# Patient Record
Sex: Female | Born: 1965 | Race: Black or African American | Hispanic: No | Marital: Single | Smoking: Never smoker
Health system: Southern US, Community
[De-identification: ages and names within clinical notes are randomized; demographics above are authoritative.]

## PROBLEM LIST (undated history)

## (undated) DIAGNOSIS — F32A Depression, unspecified: Secondary | ICD-10-CM

## (undated) DIAGNOSIS — F419 Anxiety disorder, unspecified: Secondary | ICD-10-CM

## (undated) DIAGNOSIS — F329 Major depressive disorder, single episode, unspecified: Secondary | ICD-10-CM

## (undated) DIAGNOSIS — B009 Herpesviral infection, unspecified: Secondary | ICD-10-CM

## (undated) HISTORY — PX: BREAST BIOPSY: SHX20

## (undated) HISTORY — DX: Major depressive disorder, single episode, unspecified: F32.9

## (undated) HISTORY — DX: Herpesviral infection, unspecified: B00.9

## (undated) HISTORY — DX: Depression, unspecified: F32.A

## (undated) HISTORY — DX: Anxiety disorder, unspecified: F41.9

## (undated) HISTORY — PX: TUBAL LIGATION: SHX77

---

## 2000-12-25 ENCOUNTER — Emergency Department (HOSPITAL_COMMUNITY): Admission: EM | Admit: 2000-12-25 | Discharge: 2000-12-25 | Payer: Self-pay | Admitting: *Deleted

## 2001-07-07 ENCOUNTER — Emergency Department (HOSPITAL_COMMUNITY): Admission: EM | Admit: 2001-07-07 | Discharge: 2001-07-07 | Payer: Self-pay | Admitting: Emergency Medicine

## 2001-12-11 ENCOUNTER — Emergency Department (HOSPITAL_COMMUNITY): Admission: EM | Admit: 2001-12-11 | Discharge: 2001-12-11 | Payer: Self-pay | Admitting: *Deleted

## 2006-03-01 ENCOUNTER — Ambulatory Visit: Payer: Self-pay | Admitting: Orthopedic Surgery

## 2006-04-01 ENCOUNTER — Emergency Department (HOSPITAL_COMMUNITY): Admission: EM | Admit: 2006-04-01 | Discharge: 2006-04-01 | Payer: Self-pay | Admitting: Emergency Medicine

## 2007-06-28 ENCOUNTER — Ambulatory Visit (HOSPITAL_COMMUNITY): Admission: RE | Admit: 2007-06-28 | Discharge: 2007-06-28 | Payer: Self-pay | Admitting: Obstetrics & Gynecology

## 2008-02-28 ENCOUNTER — Emergency Department (HOSPITAL_COMMUNITY): Admission: EM | Admit: 2008-02-28 | Discharge: 2008-02-28 | Payer: Self-pay | Admitting: Emergency Medicine

## 2008-06-11 ENCOUNTER — Ambulatory Visit (HOSPITAL_COMMUNITY): Admission: RE | Admit: 2008-06-11 | Discharge: 2008-06-11 | Payer: Self-pay | Admitting: Family Medicine

## 2011-01-25 NOTE — Op Note (Signed)
[name], Traci Pratt              ACCOUNT NO.:  [id number]   MEDICAL RECORD NO.:  [id number]          PATIENT TYPE:  AMB   LOCATION:  DAY                           FACILITY:  [facility]   PHYSICIAN:  Lazaro Arms, M.D.   DATE OF BIRTH:  [birth year]   DATE OF PROCEDURE:  06/28/2007  DATE OF DISCHARGE:                               OPERATIVE REPORT   PREOPERATIVE DIAGNOSIS:  Patient desires permanent sterilization.   POSTOPERATIVE DIAGNOSIS:  Patient desires permanent sterilization.   PROCEDURE:  Laparoscopic tubal ligation using electrocautery.   SURGEON:  Lazaro Arms, M.D.   ANESTHESIA:  General endotracheal.   FINDINGS:  Patient had a normal uterus, tubes, and ovaries.   DESCRIPTION OF OPERATION:  Patient was taken to the operating room,  placed in the supine position.  Underwent general endotracheal  anesthesia.  Placed in dorsal lithotomy position.  Prepped and draped in  the usual sterile fashion.  A Hulka tenaculum was placed in the  endometrium.  An incision was made in the umbilicus.  An 11 mm nonbladed  trocar with direct visualization was used.  The peritoneal cavity was  entered without difficulty.  Fallopian tubes were identified, __________  and beyond distal __________  the region of each tube, 2.5 to 3 cm  bilaterally, with good hemostasis.  The pelvis was again found to be  normal.  Instruments were removed, gas allowed to escape.  The fascia  was closed as a single 0 Vicryl suture.  The skin was closed using skin  staples.  Patient tolerated the procedure well.  She experienced no  blood loss.  Was taken to the recovery room in good and stable  condition.  All counts were correct.      Lazaro Arms, M.D.  Electronically Signed     LHE/MEDQ  D:  06/28/2007  T:  06/28/2007  Job:  [id number]

## 2011-05-05 ENCOUNTER — Encounter: Payer: Self-pay | Admitting: Family Medicine

## 2011-05-05 ENCOUNTER — Ambulatory Visit (INDEPENDENT_AMBULATORY_CARE_PROVIDER_SITE_OTHER): Payer: BC Managed Care – PPO | Admitting: Family Medicine

## 2011-05-05 VITALS — BP 110/78 | HR 103 | Ht 70.5 in | Wt 168.0 lb

## 2011-05-05 DIAGNOSIS — F419 Anxiety disorder, unspecified: Secondary | ICD-10-CM

## 2011-05-05 DIAGNOSIS — E559 Vitamin D deficiency, unspecified: Secondary | ICD-10-CM

## 2011-05-05 DIAGNOSIS — F329 Major depressive disorder, single episode, unspecified: Secondary | ICD-10-CM

## 2011-05-05 DIAGNOSIS — G4700 Insomnia, unspecified: Secondary | ICD-10-CM

## 2011-05-05 DIAGNOSIS — E785 Hyperlipidemia, unspecified: Secondary | ICD-10-CM

## 2011-05-05 DIAGNOSIS — F411 Generalized anxiety disorder: Secondary | ICD-10-CM

## 2011-05-05 DIAGNOSIS — F32A Depression, unspecified: Secondary | ICD-10-CM

## 2011-05-05 MED ORDER — VITAMIN D 1000 UNITS PO CAPS: 1000.0000 [IU] | ORAL_CAPSULE | Freq: Every day | ORAL | Status: DC

## 2011-05-05 MED ORDER — ZOLPIDEM TARTRATE ER 12.5 MG PO TBCR: 12.5000 mg | EXTENDED_RELEASE_TABLET | Freq: Every evening | ORAL | Status: DC | PRN

## 2011-05-05 NOTE — Progress Notes (Signed)
  Subjective:    Patient ID: Traci Pratt, female    DOB: 04/29/1966, 45 y.o.   MRN: [id number]  HPI  Pt here to establish care Previous Dr. Tammy Sours  in [place] Medication and History reviewed Depression/anxiety- on Pamelor for many years, has side effects of dry mouth, has tried Paxil, wellbutrin, she was also on Pristiq samples by her PCP (note pt did not know the name of this during the visit, obtained from pharmacy) but her insurance will not cover. Depression stems from a lot of family stress and loss of her parents. No hospitlizations, no SI. Would like medication similar to her pristiq, because she feels like her mood has been off since she ran out of the meds, she is more irritable, has found herself crying and overall does not feel well.   Insomnia- uses ambien CR has been on this for years , work starts at [employer], works 12 hour shifts , has tried Tylenol pm, Lunesta,    Menopausal- for past 4 years, started age 34, had BTL at age 33, has had hormonal work up by previous PCP  Vitamin D deficiency- has completed Vitamin D 8 week course, but not sure how much to take now  Hyperlipidemia- told her cholesterol was "out of whack", no meds given by previous PCP     Review of Systems   GEN- denies fatigue, fever, weight loss,weakness, recent illness HEENT- denies eye drainage, change in vision, nasal discharge, CVS- denies chest pain, palpitations RESP- denies SOB, cough, wheeze ABD- denies N/V, +constipations , abd pain GU- denies dysuria, hematuria, dribbling, incontinence MSK- denies joint pain, +muscle aches-feet after long hours, injury Neuro- denies headache, dizziness, syncope, seizure activity      Objective:   Physical Exam GEN- NAD, alert and oriented x 3  HEENT- fair dentition, PERRL, EOMI, Non icteric, pink conjunctiva, MMM, mild proptosis  Neck- no thyromegaly CVS- mild tachycardia, no murmur  resp- ctab ABD- NABS, soft, NT, ND EXT- no edema Pulses- 2+ radial ,  DP Psych- not depressed appearing, not overly anxious, no apparent SI, no apparent hallucinations       Assessment & Plan:

## 2011-05-05 NOTE — Patient Instructions (Addendum)
Follow up in 1 month  We will follow-up on your medication  I will get records from Dr. Parke Simmers Use inserts for your shoes Take the vitamin D once a day  Schedule a visit for your PAP Smear I will set you up for a Mammogram

## 2011-05-06 ENCOUNTER — Encounter: Payer: Self-pay | Admitting: Family Medicine

## 2011-05-06 DIAGNOSIS — F32A Depression, unspecified: Secondary | ICD-10-CM | POA: Insufficient documentation

## 2011-05-06 DIAGNOSIS — E785 Hyperlipidemia, unspecified: Secondary | ICD-10-CM | POA: Insufficient documentation

## 2011-05-06 DIAGNOSIS — F419 Anxiety disorder, unspecified: Secondary | ICD-10-CM | POA: Insufficient documentation

## 2011-05-06 DIAGNOSIS — G4700 Insomnia, unspecified: Secondary | ICD-10-CM | POA: Insufficient documentation

## 2011-05-06 DIAGNOSIS — F329 Major depressive disorder, single episode, unspecified: Secondary | ICD-10-CM | POA: Insufficient documentation

## 2011-05-06 MED ORDER — VENLAFAXINE HCL 37.5 MG PO TABS: 37.5000 mg | ORAL_TABLET | Freq: Two times a day (BID) | ORAL | Status: DC

## 2011-05-06 NOTE — Assessment & Plan Note (Signed)
Patient has history of both depression and anxiety. She is also describing panic attacks. After calling her pharmacy it appears she was on nortriptyline as well as Pristique. Her sugars has not quite cover that medication. I will start her on Effexor which is very similar to that. She will followup in 4 weeks to see how her mood has improved.

## 2011-05-06 NOTE — Assessment & Plan Note (Signed)
Obtain records from PCP and review lipid panel before making any recommendations

## 2011-05-06 NOTE — Assessment & Plan Note (Signed)
Start maintenance Vitamin D

## 2011-05-06 NOTE — Assessment & Plan Note (Signed)
Per above, long standing history, change to effexor, no red flags

## 2011-05-06 NOTE — Assessment & Plan Note (Signed)
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

## 2011-06-06 ENCOUNTER — Encounter: Payer: BC Managed Care – PPO | Admitting: Family Medicine

## 2011-06-09 LAB — CBC
MCHC: 34.5
Platelets: 306
RBC: 4.57
RDW: 13.7

## 2011-06-09 LAB — URINALYSIS, ROUTINE W REFLEX MICROSCOPIC
Specific Gravity, Urine: 1.02
Urobilinogen, UA: 0.2

## 2011-06-09 LAB — BASIC METABOLIC PANEL
CO2: 25
Calcium: 9.1
Creatinine, Ser: 0.9
GFR calc Af Amer: >60
Glucose, Bld: 146 — ABNORMAL HIGH

## 2011-06-09 LAB — PREGNANCY, URINE: Preg Test, Ur: NEGATIVE

## 2011-06-09 LAB — URINE MICROSCOPIC-ADD ON

## 2011-06-23 LAB — URINALYSIS, ROUTINE W REFLEX MICROSCOPIC
Glucose, UA: NEGATIVE
Nitrite: NEGATIVE
Protein, ur: NEGATIVE
pH: 7

## 2011-06-23 LAB — BASIC METABOLIC PANEL
Calcium: 9.2
Creatinine, Ser: 0.94
GFR calc Af Amer: >60
GFR calc non Af Amer: >60

## 2011-06-23 LAB — HCG, QUANTITATIVE, PREGNANCY: hCG, Beta Chain, Quant, S: <2

## 2011-06-23 LAB — DIFFERENTIAL
Lymphs Abs: 1.7
Monocytes Relative: 5
Neutro Abs: 2.3
Neutrophils Relative %: 52

## 2011-06-23 LAB — CBC
RBC: 4.23
WBC: 4.5

## 2011-06-27 ENCOUNTER — Ambulatory Visit (INDEPENDENT_AMBULATORY_CARE_PROVIDER_SITE_OTHER): Payer: BC Managed Care – PPO | Admitting: Family Medicine

## 2011-06-27 ENCOUNTER — Other Ambulatory Visit (HOSPITAL_COMMUNITY)
Admission: RE | Admit: 2011-06-27 | Discharge: 2011-06-27 | Disposition: A | Discharge status: Home / Self Care | Payer: BC Managed Care – PPO | Source: Ambulatory Visit | Attending: Family Medicine | Admitting: Family Medicine

## 2011-06-27 ENCOUNTER — Encounter: Payer: Self-pay | Admitting: Family Medicine

## 2011-06-27 VITALS — BP 126/90 | HR 76 | Resp 16 | Ht 71.5 in | Wt 171.0 lb

## 2011-06-27 DIAGNOSIS — E559 Vitamin D deficiency, unspecified: Secondary | ICD-10-CM

## 2011-06-27 DIAGNOSIS — Z124 Encounter for screening for malignant neoplasm of cervix: Secondary | ICD-10-CM

## 2011-06-27 DIAGNOSIS — F32A Depression, unspecified: Secondary | ICD-10-CM

## 2011-06-27 DIAGNOSIS — Z0000 Encounter for general adult medical examination without abnormal findings: Secondary | ICD-10-CM

## 2011-06-27 DIAGNOSIS — Z1231 Encounter for screening mammogram for malignant neoplasm of breast: Secondary | ICD-10-CM

## 2011-06-27 DIAGNOSIS — F329 Major depressive disorder, single episode, unspecified: Secondary | ICD-10-CM

## 2011-06-27 DIAGNOSIS — Z01419 Encounter for gynecological examination (general) (routine) without abnormal findings: Secondary | ICD-10-CM | POA: Insufficient documentation

## 2011-06-27 DIAGNOSIS — F3289 Other specified depressive episodes: Secondary | ICD-10-CM

## 2011-06-27 DIAGNOSIS — E785 Hyperlipidemia, unspecified: Secondary | ICD-10-CM

## 2011-06-27 MED ORDER — VITAMIN D 1000 UNITS PO CAPS: 1000.0000 [IU] | ORAL_CAPSULE | Freq: Every day | ORAL | Status: AC

## 2011-06-27 MED ORDER — VALACYCLOVIR HCL 1 G PO TABS: 1000.0000 mg | ORAL_TABLET | Freq: Every day | ORAL | Status: DC

## 2011-06-27 NOTE — Patient Instructions (Addendum)
We will set you up for an evening Mammogram ( After 3pm) Take the vitamin D daily Continue your other medications We will send a letter with PAP Smear results GEt your labs done- do not eat after midnight F/U in 4 months for mood

## 2011-06-28 ENCOUNTER — Encounter: Payer: Self-pay | Admitting: Family Medicine

## 2011-06-28 NOTE — Assessment & Plan Note (Signed)
Stable on meds  

## 2011-06-28 NOTE — Progress Notes (Signed)
  Subjective:    Patient ID: Traci Pratt, female    DOB: 1966/01/04, 45 y.o.   MRN: [id number]  HPI Pt here for CPE- needs PAP, Mammogram Depression-Tolerating medications- initially tired with starting effexor, feels mood is stable, doing well, no crying episodes, does not feel depressed Not taking Vit D  Admitted she has history of HSV, no recent outbreaks, would like her Valtrex filled again, in case needed  Review of Systems     GEN- denies fatigue, fever, weight loss,weakness, recent illness HEENT- denies eye drainage, change in vision, nasal discharge, CVS- denies chest pain, palpitations RESP- denies SOB, cough, wheeze ABD- denies N/V, change in stools, abd pain GU- denies dysuria, hematuria, dribbling, incontinence GYN- no vaginal discharge, no current HSV lesions, no vaginal bleeding      Objective:   Physical Exam GEN- NAD, alert and oriented, HEENT- PERRL, EOMI, TM Clear bilat, caps on teeth, oropharynx clear CVS-RRR, no murmur RESP-CTAB Neck- supple, no thyromegaly Breast- normal symmetry, no nipple inversion,no nipple drainage, no nodules or lumps felt Nodes- no axillary nodes GU- normal external genitalia, vaginal mucosa pink and moist, cervix visualized no growth, no blood form os, no discharge, no CMT, no ovarian masses, uterus normal size Psych- not depressed or anxious appearing       Assessment & Plan:   CPE - done, PAP Smear done, send for Mammogram and labs  Vit D- pt to restart Vitamin D, check levels  Depression- overall mood stable and improved, continue Effexor

## 2011-07-05 ENCOUNTER — Telehealth: Payer: Self-pay | Admitting: *Deleted

## 2011-07-05 ENCOUNTER — Ambulatory Visit (HOSPITAL_COMMUNITY): Payer: BC Managed Care – PPO

## 2011-07-05 ENCOUNTER — Encounter: Payer: Self-pay | Admitting: *Deleted

## 2011-07-05 ENCOUNTER — Ambulatory Visit (HOSPITAL_COMMUNITY)
Admission: RE | Admit: 2011-07-05 | Discharge: 2011-07-05 | Disposition: A | Discharge status: Home / Self Care | Payer: BC Managed Care – PPO | Source: Ambulatory Visit | Attending: Family Medicine | Admitting: Family Medicine

## 2011-07-05 DIAGNOSIS — Z1231 Encounter for screening mammogram for malignant neoplasm of breast: Secondary | ICD-10-CM | POA: Insufficient documentation

## 2011-07-05 NOTE — Telephone Encounter (Signed)
Mailed normal pap letter

## 2011-07-05 NOTE — Telephone Encounter (Signed)
Message copied by Diamantina Monks on Tue Jul 05, 2011  2:13 PM ------      Message from: Milinda Antis F      Created: Wed Jun 29, 2011  1:17 PM       Normal PAP Smear

## 2011-07-15 ENCOUNTER — Other Ambulatory Visit: Payer: Self-pay | Admitting: Family Medicine

## 2011-07-15 DIAGNOSIS — R928 Other abnormal and inconclusive findings on diagnostic imaging of breast: Secondary | ICD-10-CM

## 2011-07-18 ENCOUNTER — Other Ambulatory Visit: Payer: Self-pay | Admitting: Family Medicine

## 2011-07-18 ENCOUNTER — Telehealth: Payer: Self-pay | Admitting: Family Medicine

## 2011-07-18 DIAGNOSIS — R928 Other abnormal and inconclusive findings on diagnostic imaging of breast: Secondary | ICD-10-CM

## 2011-07-18 NOTE — Telephone Encounter (Signed)
I left a message for pt to return call, I need to know if she was contacted for further imaging for abnormal Mammogram

## 2011-07-29 NOTE — Telephone Encounter (Signed)
Yes, she goes on the 19th

## 2011-08-01 ENCOUNTER — Ambulatory Visit
Admission: RE | Admit: 2011-08-01 | Discharge: 2011-08-01 | Disposition: A | Discharge status: Home / Self Care | Payer: BC Managed Care – PPO | Source: Ambulatory Visit | Attending: Family Medicine | Admitting: Family Medicine

## 2011-08-01 DIAGNOSIS — R928 Other abnormal and inconclusive findings on diagnostic imaging of breast: Secondary | ICD-10-CM

## 2011-08-31 ENCOUNTER — Other Ambulatory Visit: Payer: Self-pay

## 2011-08-31 MED ORDER — ZOLPIDEM TARTRATE ER 12.5 MG PO TBCR: 12.5000 mg | EXTENDED_RELEASE_TABLET | Freq: Every evening | ORAL | Status: DC | PRN

## 2011-09-15 ENCOUNTER — Other Ambulatory Visit: Payer: Self-pay | Admitting: Family Medicine

## 2011-09-22 ENCOUNTER — Other Ambulatory Visit: Payer: Self-pay

## 2011-09-29 ENCOUNTER — Telehealth: Payer: Self-pay | Admitting: Family Medicine

## 2011-09-29 ENCOUNTER — Other Ambulatory Visit: Payer: Self-pay

## 2011-09-29 MED ORDER — ZOLPIDEM TARTRATE ER 12.5 MG PO TBCR: 12.5000 mg | EXTENDED_RELEASE_TABLET | Freq: Every evening | ORAL | Status: DC | PRN

## 2011-09-29 NOTE — Telephone Encounter (Signed)
[facility]illed [facility]

## 2011-10-28 ENCOUNTER — Ambulatory Visit: Payer: BC Managed Care – PPO | Admitting: Family Medicine

## 2011-11-01 ENCOUNTER — Encounter: Payer: Self-pay | Admitting: Family Medicine

## 2011-11-01 ENCOUNTER — Ambulatory Visit: Payer: BC Managed Care – PPO | Admitting: Family Medicine

## 2011-11-11 ENCOUNTER — Ambulatory Visit (INDEPENDENT_AMBULATORY_CARE_PROVIDER_SITE_OTHER): Payer: BC Managed Care – PPO | Admitting: Family Medicine

## 2011-11-11 ENCOUNTER — Encounter: Payer: Self-pay | Admitting: Family Medicine

## 2011-11-11 VITALS — BP 122/76 | HR 93 | Resp 18 | Ht 71.5 in | Wt 169.0 lb

## 2011-11-11 DIAGNOSIS — F3289 Other specified depressive episodes: Secondary | ICD-10-CM

## 2011-11-11 DIAGNOSIS — F419 Anxiety disorder, unspecified: Secondary | ICD-10-CM

## 2011-11-11 DIAGNOSIS — F329 Major depressive disorder, single episode, unspecified: Secondary | ICD-10-CM

## 2011-11-11 DIAGNOSIS — F32A Depression, unspecified: Secondary | ICD-10-CM

## 2011-11-11 DIAGNOSIS — F411 Generalized anxiety disorder: Secondary | ICD-10-CM

## 2011-11-11 DIAGNOSIS — M25449 Effusion, unspecified hand: Secondary | ICD-10-CM

## 2011-11-11 DIAGNOSIS — M2540 Effusion, unspecified joint: Secondary | ICD-10-CM

## 2011-11-11 DIAGNOSIS — G4700 Insomnia, unspecified: Secondary | ICD-10-CM

## 2011-11-11 DIAGNOSIS — E785 Hyperlipidemia, unspecified: Secondary | ICD-10-CM

## 2011-11-11 MED ORDER — MELOXICAM 15 MG PO TABS: 15.0000 mg | ORAL_TABLET | Freq: Every day | ORAL | Status: DC

## 2011-11-11 MED ORDER — ZOLPIDEM TARTRATE ER 12.5 MG PO TBCR: 12.5000 mg | EXTENDED_RELEASE_TABLET | Freq: Every evening | ORAL | Status: DC | PRN

## 2011-11-11 NOTE — Patient Instructions (Signed)
Try Miralax 1 cap full twice a day as needed Dulcolax/ Senna kot daily as needed  Continue the effexor Get the x-rays of your hands Get the blood work done fasting  We will call with lab results and x-ray results Start Mobic for your joints. F/U in 4 weeks

## 2011-11-13 ENCOUNTER — Encounter: Payer: Self-pay | Admitting: Family Medicine

## 2011-11-13 DIAGNOSIS — M25449 Effusion, unspecified hand: Secondary | ICD-10-CM | POA: Insufficient documentation

## 2011-11-13 NOTE — Assessment & Plan Note (Addendum)
FLP due

## 2011-11-13 NOTE — Progress Notes (Signed)
  Subjective:    Patient ID: Traci Pratt, female    DOB: 1966/07/04, 46 y.o.   MRN: [id number]  HPI Pt here for routine follow-up  Depression/anxiety- taking effexor feels her anxiety is much better, her mood is better in general with the medication, but her appetite is down some. No hallucinations  Hand pain- joints are very stiff, throughout the day, she is unable to make a complete fist, she has had problems for a few years but it is getting worse, she has been using ibuprofen for some low back pain but this has not helped.  Insomnia- taking Ambien CR, able to fall asleep but does not stay asleep, she does not want anything to make her drowsy during the day, has tried Lunesta  Abnormal Mammogram- now clear, f/u imaging normal  Review of Systems GEN- denies fatigue, fever, weight loss,weakness, recent illness HEENT- denies eye drainage, change in vision, nasal discharge, CVS- denies chest pain, palpitations RESP- denies SOB, cough, wheeze ABD- denies N/V, + constipation/change in stools, abd pain GU- denies dysuria, hematuria, dribbling, incontinence MSK- + joint pain, muscle aches, injury Neuro- denies headache, dizziness, syncope, seizure activity        Objective:   Physical Exam GEN- NAD, alert and oriented x3 HEENT- PERRL, EOMI, non injected sclera, pink conjunctiva, MMM, oropharynx clear Neck- Supple, no thyromegaly CVS- RRR, no murmur RESP-CTAB EXT- No edema Hands- very stiff ROM, unable to make complete fist with right hand, mild swelling at MIP bilat, swans like curvature to bilateral hands R>L Pulses- Radial, DP- 2+ Psych- not depressed appearing, no anxious appearing       Assessment & Plan:

## 2011-11-13 NOTE — Assessment & Plan Note (Signed)
Obtain imaging, check inflammatory markers, and RF Start mobic

## 2011-11-13 NOTE — Assessment & Plan Note (Signed)
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

## 2011-11-13 NOTE — Assessment & Plan Note (Signed)
Continue meds, stable 

## 2011-11-13 NOTE — Assessment & Plan Note (Signed)
Overall doing much better with exception of sleep which has been a long term problems for her, continue meds

## 2011-12-01 ENCOUNTER — Other Ambulatory Visit: Payer: Self-pay | Admitting: Family Medicine

## 2011-12-01 ENCOUNTER — Ambulatory Visit (HOSPITAL_COMMUNITY)
Admission: RE | Admit: 2011-12-01 | Discharge: 2011-12-01 | Disposition: A | Discharge status: Home / Self Care | Payer: BC Managed Care – PPO | Source: Ambulatory Visit | Attending: Family Medicine | Admitting: Family Medicine

## 2011-12-01 DIAGNOSIS — M2540 Effusion, unspecified joint: Secondary | ICD-10-CM

## 2011-12-01 DIAGNOSIS — M7989 Other specified soft tissue disorders: Secondary | ICD-10-CM | POA: Insufficient documentation

## 2011-12-02 LAB — LIPID PANEL
Cholesterol: 188 mg/dL (ref 0–200)
HDL: 76 mg/dL (ref >39)
LDL Cholesterol: 105 mg/dL — ABNORMAL HIGH (ref 0–99)
VLDL: 7 mg/dL (ref 0–40)

## 2011-12-02 LAB — C-REACTIVE PROTEIN: CRP: 0.2 mg/dL (ref <0.60)

## 2011-12-02 LAB — RHEUMATOID FACTOR: Rhuematoid fact SerPl-aCnc: <10 IU/mL (ref <=14)

## 2011-12-02 LAB — COMPREHENSIVE METABOLIC PANEL
ALT: 17 U/L (ref 0–35)
AST: 17 U/L (ref 0–37)
Chloride: 104 mEq/L (ref 96–112)
Creat: 0.73 mg/dL (ref 0.50–1.10)
Sodium: 141 mEq/L (ref 135–145)
Total Bilirubin: 0.6 mg/dL (ref 0.3–1.2)

## 2011-12-02 LAB — CBC
HCT: 43.1 % (ref 36.0–46.0)
MCHC: 32.3 g/dL (ref 30.0–36.0)
MCV: 92.3 fL (ref 78.0–100.0)
RDW: 13.4 % (ref 11.5–15.5)

## 2011-12-02 LAB — SEDIMENTATION RATE: Sed Rate: 4 mm/hr (ref 0–22)

## 2011-12-04 LAB — VITAMIN D 1,25 DIHYDROXY
Vitamin D 1, 25 (OH)2 Total: 81 pg/mL — ABNORMAL HIGH (ref 18–72)
Vitamin D2 1, 25 (OH)2: <8 pg/mL

## 2011-12-16 ENCOUNTER — Ambulatory Visit: Payer: BC Managed Care – PPO | Admitting: Family Medicine

## 2011-12-20 ENCOUNTER — Ambulatory Visit (INDEPENDENT_AMBULATORY_CARE_PROVIDER_SITE_OTHER): Payer: BC Managed Care – PPO | Admitting: Family Medicine

## 2011-12-20 ENCOUNTER — Encounter: Payer: Self-pay | Admitting: Family Medicine

## 2011-12-20 VITALS — BP 118/70 | HR 88 | Resp 18 | Ht 71.5 in | Wt 172.0 lb

## 2011-12-20 DIAGNOSIS — F411 Generalized anxiety disorder: Secondary | ICD-10-CM

## 2011-12-20 DIAGNOSIS — G4700 Insomnia, unspecified: Secondary | ICD-10-CM

## 2011-12-20 DIAGNOSIS — F3289 Other specified depressive episodes: Secondary | ICD-10-CM

## 2011-12-20 DIAGNOSIS — F32A Depression, unspecified: Secondary | ICD-10-CM

## 2011-12-20 DIAGNOSIS — E673 Hypervitaminosis D: Secondary | ICD-10-CM

## 2011-12-20 DIAGNOSIS — F329 Major depressive disorder, single episode, unspecified: Secondary | ICD-10-CM

## 2011-12-20 DIAGNOSIS — M25449 Effusion, unspecified hand: Secondary | ICD-10-CM

## 2011-12-20 DIAGNOSIS — F419 Anxiety disorder, unspecified: Secondary | ICD-10-CM

## 2011-12-20 MED ORDER — ZOLPIDEM TARTRATE ER 12.5 MG PO TBCR: 12.5000 mg | EXTENDED_RELEASE_TABLET | Freq: Every evening | ORAL | Status: DC | PRN

## 2011-12-20 MED ORDER — IBUPROFEN 800 MG PO TABS: 800.0000 mg | ORAL_TABLET | Freq: Three times a day (TID) | ORAL | Status: AC | PRN

## 2011-12-20 NOTE — Progress Notes (Signed)
  Subjective:    Patient ID: Traci Pratt, female    DOB: 1965-10-07, 46 y.o.   MRN: [id number]  HPI  Pt here for interim follow-up on labs and joint swelling, continues to have swelling in hands, difficulty making a fist and pain in hand joints. X rays reviewed  Insomnia- taking ambien but still does not stay asleep   Constipation- had BM with eating pecans, also using stool softner Labs reviewed  Review of Systems   GEN- denies fatigue, fever, weight loss,weakness, recent illness MSK- + joint pain, muscle aches, injury Neuro- denies headache, dizziness, syncope, seizure activity      Objective:   Physical Exam GEN-NAD, alert and oriented x 3 Hands-  Decreased flexion at MIP bilat, unable to make complete fist with right hand, mild swelling at MIP bilat, swans like curvature to bilateral hands R>L Pulses- Radial, DP- 2+ Psych- not depressed appearing, no anxious appearing         Assessment & Plan:

## 2011-12-20 NOTE — Assessment & Plan Note (Signed)
Stable, continue meds 

## 2011-12-20 NOTE — Patient Instructions (Signed)
Continue the current medications Use the ibuprofen as needed I will refer you to rheumatology Get your labs to your work F/U 4 months

## 2011-12-20 NOTE — Assessment & Plan Note (Addendum)
Normal ESR, RF, CRP X-rays unremarkable however exam still concerning. Will send to rheumatology for their assessment, pt concerned she may not be able to continue working if this worsens Mobic completed but little improvement Continue ibuprofen

## 2011-12-20 NOTE — Assessment & Plan Note (Signed)
My concern persist with other meds because she is on TCA and SSRI. She has tried [place], currently on Ambien. Will try to increase her Nortriptyline to 125mg .

## 2011-12-20 NOTE — Assessment & Plan Note (Signed)
Doing well, on effexor and TCA

## 2011-12-20 NOTE — Assessment & Plan Note (Signed)
Vit D elevated at 82, slightly above normal, pt has stopped extra supplement

## 2012-01-12 ENCOUNTER — Telehealth: Payer: Self-pay | Admitting: Family Medicine

## 2012-01-12 MED ORDER — NORTRIPTYLINE HCL 25 MG PO CAPS: ORAL_CAPSULE | ORAL | Status: DC

## 2012-01-12 MED ORDER — VENLAFAXINE HCL 37.5 MG PO TABS: ORAL_TABLET | ORAL | Status: DC

## 2012-01-12 NOTE — Telephone Encounter (Signed)
Spoke with pt, she is tolerating increased dose of TCA well. Meds sent for 90 day supply to [facility]

## 2012-01-17 ENCOUNTER — Ambulatory Visit: Payer: BC Managed Care – PPO | Admitting: Family Medicine

## 2012-01-27 ENCOUNTER — Ambulatory Visit: Payer: BC Managed Care – PPO | Admitting: Family Medicine

## 2012-03-13 ENCOUNTER — Other Ambulatory Visit: Payer: Self-pay | Admitting: Family Medicine

## 2012-03-13 ENCOUNTER — Other Ambulatory Visit: Payer: Self-pay

## 2012-03-13 ENCOUNTER — Telehealth: Payer: Self-pay | Admitting: Family Medicine

## 2012-03-13 NOTE — Telephone Encounter (Signed)
noted 

## 2012-03-21 ENCOUNTER — Ambulatory Visit: Payer: BC Managed Care – PPO | Admitting: Family Medicine

## 2012-03-23 ENCOUNTER — Ambulatory Visit (INDEPENDENT_AMBULATORY_CARE_PROVIDER_SITE_OTHER): Payer: BC Managed Care – PPO | Admitting: Family Medicine

## 2012-03-23 ENCOUNTER — Encounter: Payer: Self-pay | Admitting: Family Medicine

## 2012-03-23 VITALS — BP 122/82 | HR 82 | Resp 16 | Ht 71.5 in | Wt 170.1 lb

## 2012-03-23 DIAGNOSIS — F32A Depression, unspecified: Secondary | ICD-10-CM

## 2012-03-23 DIAGNOSIS — G4700 Insomnia, unspecified: Secondary | ICD-10-CM

## 2012-03-23 DIAGNOSIS — F419 Anxiety disorder, unspecified: Secondary | ICD-10-CM

## 2012-03-23 DIAGNOSIS — M25449 Effusion, unspecified hand: Secondary | ICD-10-CM

## 2012-03-23 DIAGNOSIS — F329 Major depressive disorder, single episode, unspecified: Secondary | ICD-10-CM

## 2012-03-23 DIAGNOSIS — F411 Generalized anxiety disorder: Secondary | ICD-10-CM

## 2012-03-23 DIAGNOSIS — F3289 Other specified depressive episodes: Secondary | ICD-10-CM

## 2012-03-23 NOTE — Patient Instructions (Signed)
Continue current medications Call Dr. Kellie Simmering for your hands F/U Mid October ( After 12th) for your physical with PAP Smear

## 2012-03-25 ENCOUNTER — Encounter: Payer: Self-pay | Admitting: Family Medicine

## 2012-03-25 NOTE — Assessment & Plan Note (Signed)
Stable no change to meds 

## 2012-03-25 NOTE — Assessment & Plan Note (Signed)
Stable, no change to meds 

## 2012-03-25 NOTE — Assessment & Plan Note (Signed)
Continue NSAIDS prn, reschedule with rheumatology

## 2012-03-25 NOTE — Assessment & Plan Note (Signed)
improved

## 2012-03-25 NOTE — Progress Notes (Signed)
  Subjective:    Patient ID: Traci Pratt, female    DOB: 1966/03/06, 46 y.o.   MRN: [id number]  HPI Pt presents to f/u chronic medical problems. SHe has no concerns. Doing well. 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 Insomnia- sleep has improved with Increase in Notriptyline to 125mg , also uses Ambien Anxiety/Depression- she is dealing with her son who has moved back home but overall feels things are going well, she has made some changes in her health and enjoys exercises and eating right Hand joint pain- she missed initial appt with rheumatologist continues to have stiffiness and pain uses OTC meds, plans to reschedule   Review of Systems  GEN- denies fatigue, fever, weight loss,weakness, recent illness HEENT- denies eye drainage, change in vision, nasal discharge, CVS- denies chest pain, palpitations RESP- denies SOB, cough, wheeze ABD- denies N/V, change in stools, abd pain GU- denies dysuria, hematuria, dribbling, incontinence MSK- + joint pain, muscle aches, injury Neuro- denies headache, dizziness, syncope, seizure activity      Objective:   Physical Exam GEN- NAD, alert and oriented x3 HEENT- PERRL, EOMI, non injected sclera, pink conjunctiva, MMM, oropharynx clear Neck- Supple, no thryomegaly CVS- RRR, no murmur RESP-CTAB ABD-NABS,soft,NT.ND EXT- No edema Pulses- Radial, DP- 2+ Psych-normal mood and affect, not depressed or overly anxious,        Assessment & Plan:

## 2012-06-05 ENCOUNTER — Other Ambulatory Visit: Payer: Self-pay | Admitting: Family Medicine

## 2012-06-05 ENCOUNTER — Telehealth: Payer: Self-pay | Admitting: Family Medicine

## 2012-06-05 ENCOUNTER — Other Ambulatory Visit: Payer: Self-pay

## 2012-06-05 NOTE — Telephone Encounter (Signed)
Med sent.

## 2012-06-15 ENCOUNTER — Other Ambulatory Visit: Payer: Self-pay

## 2012-06-15 MED ORDER — NORTRIPTYLINE HCL 25 MG PO CAPS: ORAL_CAPSULE | ORAL | Status: DC

## 2012-06-29 ENCOUNTER — Encounter: Payer: Self-pay | Admitting: Family Medicine

## 2012-06-29 ENCOUNTER — Other Ambulatory Visit (HOSPITAL_COMMUNITY)
Admission: RE | Admit: 2012-06-29 | Discharge: 2012-06-29 | Disposition: A | Discharge status: Home / Self Care | Payer: BC Managed Care – PPO | Source: Ambulatory Visit | Attending: Family Medicine | Admitting: Family Medicine

## 2012-06-29 ENCOUNTER — Ambulatory Visit (INDEPENDENT_AMBULATORY_CARE_PROVIDER_SITE_OTHER): Payer: BC Managed Care – PPO | Admitting: Family Medicine

## 2012-06-29 VITALS — BP 120/84 | HR 86 | Resp 16 | Wt 175.0 lb

## 2012-06-29 DIAGNOSIS — G5600 Carpal tunnel syndrome, unspecified upper limb: Secondary | ICD-10-CM

## 2012-06-29 DIAGNOSIS — Z23 Encounter for immunization: Secondary | ICD-10-CM

## 2012-06-29 DIAGNOSIS — Z01419 Encounter for gynecological examination (general) (routine) without abnormal findings: Secondary | ICD-10-CM

## 2012-06-29 DIAGNOSIS — Z124 Encounter for screening for malignant neoplasm of cervix: Secondary | ICD-10-CM

## 2012-06-29 DIAGNOSIS — Z1211 Encounter for screening for malignant neoplasm of colon: Secondary | ICD-10-CM

## 2012-06-29 LAB — POC HEMOCCULT BLD/STL (OFFICE/1-CARD/DIAGNOSTIC): Fecal Occult Blood, POC: NEGATIVE

## 2012-06-29 MED ORDER — SCOPOLAMINE 1 MG/3DAYS TD PT72: 1.0000 | MEDICATED_PATCH | TRANSDERMAL | Status: DC

## 2012-06-29 MED ORDER — VENLAFAXINE HCL 37.5 MG PO TABS: ORAL_TABLET | ORAL | Status: DC

## 2012-06-29 NOTE — Patient Instructions (Addendum)
I recommend eye visit once a year I recommend dental visit every 6 months Goal is to  Exercise 30 minutes 5 days a week We will send a letter with lab results  Try the wrist brace during the day to see if this helps symptoms Mammogram- please call and schedule your Mammogram at the Imaging center - Please give number for Dr. Kellie Simmering - Rhuematology Brace to [facility] F/U 4 months

## 2012-06-29 NOTE — Progress Notes (Signed)
  Subjective:    Patient ID: Traci Pratt, female    DOB: 19-May-1966, 46 y.o.   MRN: [id number]  HPI Patient here for GYN exam. She's been having tingling and numbness in her right hand over the thumb and first digit. She notices that when she goes to pick up items. It does not awaken her from sleep. She no longer has a menses. She's going on a cruise and is asking for something for nausea   Review of Systems - per above  GEN- denies fatigue, fever, weight loss,weakness, recent illness HEENT- denies eye drainage, change in vision, nasal discharge, CVS- denies chest pain, palpitations RESP- denies SOB, cough, wheeze ABD- denies N/V, change in stools, abd pain GU- denies dysuria, hematuria, dribbling, incontinence MSK- denies joint pain, muscle aches, injury Neuro- denies headache, dizziness, syncope, seizure activity      Objective:   Physical Exam GEN- NAD, alert and oriented x3 HEENT- PERRL, EOMI, non injected sclera, pink conjunctiva, MMM, oropharynx clear Neck- Supple, no thyromegaly CVS- RRR, no murmur RESP-CTAB ABD-NABS,soft,NT,ND GU- normal external genitalia, vaginal mucosa pink and moist, cervix visualized no growth, no blood form os, No discharge, no CMT, no ovarian masses, uterus normal size Rectal- normal tone, FOBT neg EXT- No edema Pulses- Radial, DP- 2+ Right Hand- Unable to make tight fist, neg tinels, neg phalens, normal tone, grasp good bilat        Assessment & Plan:    GYN Exam- PAP Smear done, Mammogram to be scheduled by patient. TDAP given,flu given at work

## 2012-07-01 DIAGNOSIS — G5600 Carpal tunnel syndrome, unspecified upper limb: Secondary | ICD-10-CM | POA: Insufficient documentation

## 2012-07-01 NOTE — Assessment & Plan Note (Signed)
Concern for possible CTS, Right hand, trial of wrist brace

## 2012-07-05 ENCOUNTER — Other Ambulatory Visit: Payer: Self-pay | Admitting: Family Medicine

## 2012-07-05 DIAGNOSIS — Z1231 Encounter for screening mammogram for malignant neoplasm of breast: Secondary | ICD-10-CM

## 2012-07-31 ENCOUNTER — Ambulatory Visit
Admission: RE | Admit: 2012-07-31 | Discharge: 2012-07-31 | Disposition: A | Discharge status: Home / Self Care | Payer: BC Managed Care – PPO | Source: Ambulatory Visit | Attending: Family Medicine | Admitting: Family Medicine

## 2012-07-31 DIAGNOSIS — Z1231 Encounter for screening mammogram for malignant neoplasm of breast: Secondary | ICD-10-CM

## 2012-08-13 ENCOUNTER — Other Ambulatory Visit: Payer: Self-pay | Admitting: Family Medicine

## 2012-08-13 DIAGNOSIS — R928 Other abnormal and inconclusive findings on diagnostic imaging of breast: Secondary | ICD-10-CM

## 2012-08-20 ENCOUNTER — Ambulatory Visit
Admission: RE | Admit: 2012-08-20 | Discharge: 2012-08-20 | Disposition: A | Discharge status: Home / Self Care | Payer: BC Managed Care – PPO | Source: Ambulatory Visit | Attending: Family Medicine | Admitting: Family Medicine

## 2012-08-20 ENCOUNTER — Other Ambulatory Visit: Payer: Self-pay | Admitting: Family Medicine

## 2012-08-20 DIAGNOSIS — R928 Other abnormal and inconclusive findings on diagnostic imaging of breast: Secondary | ICD-10-CM

## 2012-08-29 ENCOUNTER — Other Ambulatory Visit: Payer: Self-pay | Admitting: Family Medicine

## 2012-08-30 ENCOUNTER — Other Ambulatory Visit: Payer: Self-pay

## 2012-09-17 ENCOUNTER — Ambulatory Visit
Admission: RE | Admit: 2012-09-17 | Discharge: 2012-09-17 | Disposition: A | Discharge status: Home / Self Care | Payer: BC Managed Care – PPO | Source: Ambulatory Visit | Attending: Family Medicine | Admitting: Family Medicine

## 2012-09-17 ENCOUNTER — Ambulatory Visit
Admission: RE | Admit: 2012-09-17 | Discharge: 2012-09-17 | Disposition: A | Discharge status: Home / Self Care | Payer: BC Managed Care – PPO | Source: Ambulatory Visit

## 2012-09-17 ENCOUNTER — Other Ambulatory Visit: Payer: Self-pay | Admitting: Family Medicine

## 2012-09-17 DIAGNOSIS — R928 Other abnormal and inconclusive findings on diagnostic imaging of breast: Secondary | ICD-10-CM

## 2012-09-19 ENCOUNTER — Other Ambulatory Visit: Payer: Self-pay | Admitting: Family Medicine

## 2012-09-19 DIAGNOSIS — R928 Other abnormal and inconclusive findings on diagnostic imaging of breast: Secondary | ICD-10-CM

## 2012-10-05 ENCOUNTER — Telehealth: Payer: Self-pay | Admitting: Family Medicine

## 2012-10-05 ENCOUNTER — Other Ambulatory Visit: Payer: Self-pay

## 2012-10-05 MED ORDER — VALACYCLOVIR HCL 1 G PO TABS: 1000.0000 mg | ORAL_TABLET | Freq: Every day | ORAL | Status: AC

## 2012-10-05 NOTE — Telephone Encounter (Signed)
Med refilled.

## 2012-10-30 ENCOUNTER — Ambulatory Visit: Payer: BC Managed Care – PPO | Admitting: Family Medicine

## 2012-11-09 ENCOUNTER — Ambulatory Visit: Payer: BC Managed Care – PPO | Admitting: Family Medicine

## 2012-11-16 ENCOUNTER — Ambulatory Visit: Payer: BC Managed Care – PPO | Admitting: Family Medicine

## 2012-11-26 ENCOUNTER — Other Ambulatory Visit: Payer: Self-pay | Admitting: Family Medicine

## 2012-12-07 ENCOUNTER — Ambulatory Visit: Payer: BC Managed Care – PPO | Admitting: Family Medicine

## 2012-12-14 ENCOUNTER — Ambulatory Visit: Payer: BC Managed Care – PPO | Admitting: Family Medicine

## 2012-12-18 ENCOUNTER — Other Ambulatory Visit: Payer: Self-pay | Admitting: Family Medicine

## 2012-12-21 ENCOUNTER — Encounter: Payer: Self-pay | Admitting: Family Medicine

## 2012-12-21 ENCOUNTER — Ambulatory Visit (INDEPENDENT_AMBULATORY_CARE_PROVIDER_SITE_OTHER): Payer: BC Managed Care – PPO | Admitting: Family Medicine

## 2012-12-21 VITALS — BP 122/80 | HR 92 | Resp 18 | Ht 71.5 in | Wt 182.0 lb

## 2012-12-21 DIAGNOSIS — F411 Generalized anxiety disorder: Secondary | ICD-10-CM

## 2012-12-21 DIAGNOSIS — F419 Anxiety disorder, unspecified: Secondary | ICD-10-CM

## 2012-12-21 DIAGNOSIS — F329 Major depressive disorder, single episode, unspecified: Secondary | ICD-10-CM

## 2012-12-21 DIAGNOSIS — F3289 Other specified depressive episodes: Secondary | ICD-10-CM

## 2012-12-21 DIAGNOSIS — G4700 Insomnia, unspecified: Secondary | ICD-10-CM

## 2012-12-21 DIAGNOSIS — F32A Depression, unspecified: Secondary | ICD-10-CM

## 2012-12-21 MED ORDER — TEMAZEPAM 15 MG PO CAPS: 15.0000 mg | ORAL_CAPSULE | Freq: Every evening | ORAL | Status: DC | PRN

## 2012-12-21 NOTE — Patient Instructions (Addendum)
Decrease nortriptyline to 4 capsules x 4 weeks Then Decrease to 3 capsules Stop the Ambien Start temazepam F/U 2 months

## 2012-12-22 NOTE — Assessment & Plan Note (Signed)
As decreasing TCA will try restoril in place of [place]

## 2012-12-22 NOTE — Progress Notes (Signed)
  Subjective:    Patient ID: Traci Pratt, female    DOB: Jun 09, 1966, 47 y.o.   MRN: [id number]  HPI Pt here to f/u chronic medical problems. Missed a day of her TCA and felt better, states her mouth is dry a lot at the current dose and somestimes gets constipated, her bowels moved better when she missed doses of TCA. Taking effexor anxiety and panic attacks well controlled, but wants to come off medications. Had Mammogram benign biopsy. Continues to have hand joint pain, neuropathy has resolved never picked up braces, has not followed up with rheumatology now in a new department using hands more  Review of Systems  GEN- denies fatigue, fever, weight loss,weakness, recent illness HEENT- denies eye drainage, change in vision, nasal discharge, CVS- denies chest pain, palpitations RESP- denies SOB, cough, wheeze ABD- denies N/V, change in stools, abd pain GU- denies dysuria, hematuria, dribbling, incontinence MSK- + joint pain, muscle aches, injury Neuro- denies headache, dizziness, syncope, seizure activity      Objective:   Physical Exam GEN- NAD, alert and oriented x3 HEENT- PERRL, EOMI, non injected sclera, pink conjunctiva, MMM, oropharynx clear CVS- RRR, no murmur RESP-CTAB EXT- No edema Pulses- Radial, DP- 2+ Psych- normal affect and mood, not depressed or anxious appearing       Assessment & Plan:

## 2012-12-22 NOTE — Assessment & Plan Note (Signed)
We will try to taper down off TCA, see instructions start 100mg  total, then decrease to 75mg  Continue effexor, will taper this up as needed

## 2012-12-22 NOTE — Assessment & Plan Note (Signed)
Doing well, has SE from TCA wants to come off medication See below

## 2013-01-08 ENCOUNTER — Encounter: Payer: Self-pay | Admitting: *Deleted

## 2013-01-31 ENCOUNTER — Other Ambulatory Visit: Payer: Self-pay | Admitting: Family Medicine

## 2013-02-05 ENCOUNTER — Telehealth: Payer: Self-pay | Admitting: Family Medicine

## 2013-02-05 NOTE — Telephone Encounter (Signed)
Discussed medications with patient. She is tapering off her amitriptyline she states she does feel better coming off of amitriptyline from his previous side effects however she does have a lot of stressors going on as her current fianc is sick and her son recently had a baby and they are having some tension back-and-forth. She is sleeping fairly well with temazepam but it takes him a while to work for her . Plan to increase effexor- Take 2 of 37.5mg  Twice a day  Decrease the amitriptyline  to 2 capsules at bedtime  Temazepam can increase to 1-2 tablets at bedtime for insomnia  F/u 2 weeks as previous  Advised patient that her Fiance needs to be seen by his current primary doctor or he should go to the urgent care or her closest ER if he is having fever with other symptoms as I cannot see him as a new patient to home next week

## 2013-02-05 NOTE — Telephone Encounter (Signed)
Called to see what message I could forward to the DR. Left message to call back

## 2013-02-15 ENCOUNTER — Other Ambulatory Visit: Payer: Self-pay | Admitting: Family Medicine

## 2013-02-15 MED ORDER — TEMAZEPAM 30 MG PO CAPS: 30.0000 mg | ORAL_CAPSULE | Freq: Every evening | ORAL | Status: DC | PRN

## 2013-02-25 ENCOUNTER — Telehealth: Payer: Self-pay | Admitting: Family Medicine

## 2013-02-25 NOTE — Telephone Encounter (Signed)
Called and spoke with pt and she stated she is only taking 15mg . I told her she shuld be taking 30mg  per ov note and that it was refilled on the 02/15/13. She is going to call her pharmacy and call me back.

## 2013-02-25 NOTE — Telephone Encounter (Signed)
Pt called back pharmacy claims that only 15 mg of temazepam was called in, i called pharmacy and corrected the dose to 30mg .

## 2013-02-27 ENCOUNTER — Ambulatory Visit (INDEPENDENT_AMBULATORY_CARE_PROVIDER_SITE_OTHER): Payer: BC Managed Care – PPO | Admitting: Family Medicine

## 2013-02-27 VITALS — BP 116/80 | HR 82 | Temp 98.7°F | Resp 18 | Ht 70.0 in | Wt 178.0 lb

## 2013-02-27 DIAGNOSIS — F411 Generalized anxiety disorder: Secondary | ICD-10-CM

## 2013-02-27 DIAGNOSIS — F3289 Other specified depressive episodes: Secondary | ICD-10-CM

## 2013-02-27 DIAGNOSIS — G4700 Insomnia, unspecified: Secondary | ICD-10-CM

## 2013-02-27 DIAGNOSIS — F419 Anxiety disorder, unspecified: Secondary | ICD-10-CM

## 2013-02-27 DIAGNOSIS — F32A Depression, unspecified: Secondary | ICD-10-CM

## 2013-02-27 DIAGNOSIS — F329 Major depressive disorder, single episode, unspecified: Secondary | ICD-10-CM

## 2013-02-27 MED ORDER — VENLAFAXINE HCL 75 MG PO TABS: 75.0000 mg | ORAL_TABLET | Freq: Two times a day (BID) | ORAL | Status: DC

## 2013-02-27 NOTE — Patient Instructions (Signed)
Continue effexor twice a day, the new prescription sent Continue sleeping medication amitriptyline to 1 tablet once a week, then stop F/U 3 months

## 2013-02-28 ENCOUNTER — Encounter: Payer: Self-pay | Admitting: Family Medicine

## 2013-02-28 ENCOUNTER — Ambulatory Visit: Payer: BC Managed Care – PPO | Admitting: Family Medicine

## 2013-02-28 NOTE — Assessment & Plan Note (Signed)
Doing fairly well despite above , see note regarding medications

## 2013-02-28 NOTE — Progress Notes (Signed)
  Subjective:    Patient ID: Traci Pratt, female    DOB: April 10, 1966, 47 y.o.   MRN: [id number]  HPI  Pt here to f/u medications for depression and anxiety. Last visit began to taper off TCA due to pt request and multiple side effects. Bowels have improved some not as constipated, dry mouth has ceased. She is sleeping better with the restoril 30mg . Doing well on effexor 75mg  BID. She has a lot of stress surrounding her son, who now lives with the mother of his child and their family, she feels she has been pushed out. She does not get to see her grand baby and this upsets her, his in-deceiciveness about jobs and life is also stressing her as he has no money, no home to himself, and no care and will not follow through with things. She is also in a 15 year relationship which is not progressing and there does not seem to be any signs of marriage in the future and she feels she could be treated better. When asked about support from her church family and others, she states she is angry at "him" (GOD) because he took both her parents back to back and left her alone. Her job is okay, no recent changes.  Review of Systems - per above  GEN- denies fatigue, fever, weight loss,weakness, recent illness Neuro- denies headache, dizziness, syncope, seizure activity Psych-+anxiety and stress     Objective:   Physical Exam GEN-NAD,alert and oriented  x3 Psych- stressed appearing, not depressed or overly anxious, good eye contact, normal though process Speech normal      Assessment & Plan:

## 2013-02-28 NOTE — Assessment & Plan Note (Addendum)
She has a lot to work through, I think therapy would be beneficial she has tried in the past and does not think it is helpful She wants to be off of medications but has not found any ways to cope when she gets upset or feels like crying or is angry. For now continue effexor Will wean off the TCA, see instructions ,   Approx 25 minutes spent with pt > 50% spent on counseling for anxiety and depression and medications

## 2013-02-28 NOTE — Assessment & Plan Note (Signed)
Improved with restoril

## 2013-03-07 ENCOUNTER — Other Ambulatory Visit: Payer: Self-pay | Admitting: Family Medicine

## 2013-03-18 ENCOUNTER — Telehealth: Payer: Self-pay | Admitting: Family Medicine

## 2013-03-18 NOTE — Telephone Encounter (Signed)
Pt aware.

## 2013-03-18 NOTE — Telephone Encounter (Signed)
Pt called stating she is on antidepressants amitriptylene and was told to ween herself down to 1once/week then stop she is feeling drained and sick on stomach since off meds. Patient wanted to know what to do if you want to add something different or start back on meds she has no appetite and nauseaous. What can she do?

## 2013-03-18 NOTE — Telephone Encounter (Signed)
Continue the effexor 75mg  twice a day If she has been off the meds longer than 1 week, her symptoms should improve in next few days, if less and feeling sick go back to 1 tablet of amitryptiline and stay there, No other new medications

## 2013-03-25 ENCOUNTER — Telehealth: Payer: Self-pay | Admitting: Family Medicine

## 2013-03-25 NOTE — Telephone Encounter (Signed)
Spoke with pt having some withdrawel symptoms from TCA, with nausea, decreased appetite, fatigue. Her sleep has also been interrupted despite temezepam. Advised to restart TCA at 25mg  at bedtime We will need to hold here for the next few weeks, I think her symptoms will improve , We may need to take her to [facility] before titrating completely off  States she was told to take 1 tablet for week, that was not the instructions as she was on 1 per day

## 2013-03-25 NOTE — Telephone Encounter (Signed)
I called pt to find out what she needed.Traci Pratt She wants to only talk to you about her medications. I tried to get her to tell me but only wants to talk to you.

## 2013-04-01 ENCOUNTER — Telehealth: Payer: Self-pay | Admitting: Family Medicine

## 2013-04-01 NOTE — Telephone Encounter (Signed)
Called and LVM, to see if symptoms improved with nortriptiline 25mg  at bedtime added back on. If improved will have her continue the 25mg  at bedtime for next 4 weeks Then decrease to 10mg  at bedtime until seen again

## 2013-04-15 ENCOUNTER — Telehealth: Payer: Self-pay | Admitting: Family Medicine

## 2013-04-15 NOTE — Telephone Encounter (Signed)
Patient here with son today. She's been having increased anxiety and difficulty sleeping decreased appetite and weight loss. She is going through a lot with her fiance as well as her son and his child. Advise her to increase her amitriptyline back to 50 mg and continue the Effexor at the current dose She has scheduled a follow-up appt

## 2013-05-14 ENCOUNTER — Telehealth: Payer: Self-pay | Admitting: Family Medicine

## 2013-05-14 MED ORDER — TEMAZEPAM 30 MG PO CAPS: 30.0000 mg | ORAL_CAPSULE | Freq: Every evening | ORAL | Status: DC | PRN

## 2013-05-14 NOTE — Telephone Encounter (Signed)
Med phoned in °

## 2013-05-14 NOTE — Telephone Encounter (Signed)
Okay to refill? 

## 2013-05-14 NOTE — Telephone Encounter (Signed)
Ok to refill 

## 2013-05-14 NOTE — Telephone Encounter (Signed)
Temazepam 30 mg cap 1 QHS prn sleep last rf 04/17/13

## 2013-05-31 ENCOUNTER — Ambulatory Visit: Payer: BC Managed Care – PPO | Admitting: Family Medicine

## 2013-06-07 ENCOUNTER — Ambulatory Visit: Payer: BC Managed Care – PPO | Admitting: Family Medicine

## 2013-06-10 ENCOUNTER — Ambulatory Visit (INDEPENDENT_AMBULATORY_CARE_PROVIDER_SITE_OTHER): Payer: BC Managed Care – PPO | Admitting: Family Medicine

## 2013-06-10 VITALS — BP 110/80 | HR 78 | Temp 97.1°F | Resp 18 | Wt 170.0 lb

## 2013-06-10 DIAGNOSIS — G4700 Insomnia, unspecified: Secondary | ICD-10-CM

## 2013-06-10 DIAGNOSIS — F329 Major depressive disorder, single episode, unspecified: Secondary | ICD-10-CM

## 2013-06-10 DIAGNOSIS — F419 Anxiety disorder, unspecified: Secondary | ICD-10-CM

## 2013-06-10 DIAGNOSIS — F3289 Other specified depressive episodes: Secondary | ICD-10-CM

## 2013-06-10 DIAGNOSIS — F32A Depression, unspecified: Secondary | ICD-10-CM

## 2013-06-10 DIAGNOSIS — F411 Generalized anxiety disorder: Secondary | ICD-10-CM

## 2013-06-10 MED ORDER — LORAZEPAM 1 MG PO TABS: 1.0000 mg | ORAL_TABLET | Freq: Every evening | ORAL | Status: DC | PRN

## 2013-06-10 NOTE — Patient Instructions (Addendum)
Start lorazemapm at bedtime take around 7pm Flu shot given Hold the temazepam Continue all other medications Mammogram in November CAll if meds not helping sleep F/U 4 months

## 2013-06-11 ENCOUNTER — Encounter: Payer: Self-pay | Admitting: Family Medicine

## 2013-06-11 NOTE — Progress Notes (Signed)
  Subjective:    Patient ID: Traci Pratt, female    DOB: 04-22-1966, 47 y.o.   MRN: [id number]  HPI  Pt here to f/u anxiety and depression. Was unable to taper completely off Pamelor, now at 50mg  has some dry mouth. Sleep is still an issue, her mind races at night. Having difficulty with her son, who recently left the house after they had an argument and she does not know where he is staying. She is doing well on the job but when she comes home is very stressed. She wants to be off medications but knows this is not a good time.  Besides her son, often thinks of her parents who passed away.    Review of Systems  GEN- denies fatigue, fever, weight loss,weakness, recent illness HEENT- denies eye drainage, change in vision, nasal discharge, CVS- denies chest pain, palpitations RESP- denies SOB, cough, wheeze Neuro- denies headache, dizziness, syncope, seizure activity      Objective:   Physical Exam GEN-NAD, alert and oriented x3 CVS-RRR, no murmur RESP-CTAB Psych- normal affect and mood.No apparent SI, well groomed, good eye contact       Assessment & Plan:

## 2013-06-11 NOTE — Assessment & Plan Note (Signed)
Unchanged, more stress with her son Continue effexor, will hold at current dose of pamelor Will try ativan at bedtime for sleep Declines therapy

## 2013-06-11 NOTE — Assessment & Plan Note (Signed)
Stop restoril, try ativan Pamelor, trazodone, ambien, tylenol PM have not helped

## 2013-06-11 NOTE — Assessment & Plan Note (Signed)
Per above, declines therapy  approx 20 minutes spent with pt > 50% spent on counseling for depression/anxiety and medication management

## 2013-07-08 ENCOUNTER — Telehealth: Payer: Self-pay | Admitting: Family Medicine

## 2013-07-08 DIAGNOSIS — Z79899 Other long term (current) drug therapy: Secondary | ICD-10-CM

## 2013-07-08 DIAGNOSIS — E785 Hyperlipidemia, unspecified: Secondary | ICD-10-CM

## 2013-07-08 DIAGNOSIS — E559 Vitamin D deficiency, unspecified: Secondary | ICD-10-CM

## 2013-07-08 DIAGNOSIS — Z0000 Encounter for general adult medical examination without abnormal findings: Secondary | ICD-10-CM

## 2013-07-08 NOTE — Telephone Encounter (Signed)
Patient is in the wellness program at work . She needs some current labs drawn . Please contact patient and let her know when she can come in to have this done . Patient would also like to speak with Dr. Jeanice Lim . About her sleep medicine ,  She states that it takes her while to go to sleep and she doesn't stay asleep. She gets her rest but doesn't sleep the whole night.

## 2013-07-09 ENCOUNTER — Telehealth: Payer: Self-pay | Admitting: Family Medicine

## 2013-07-09 NOTE — Telephone Encounter (Signed)
LMTRC

## 2013-07-09 NOTE — Telephone Encounter (Signed)
Pt orders in for pt to come in and have done, waiting on pt to return my call, she can come her to have draw

## 2013-07-10 NOTE — Telephone Encounter (Signed)
Pt aware of message and will come in one day after 3p

## 2013-07-15 ENCOUNTER — Other Ambulatory Visit: Payer: Self-pay

## 2013-07-15 DIAGNOSIS — Z1231 Encounter for screening mammogram for malignant neoplasm of breast: Secondary | ICD-10-CM

## 2013-07-22 ENCOUNTER — Telehealth: Payer: Self-pay | Admitting: *Deleted

## 2013-07-22 NOTE — Telephone Encounter (Signed)
Okay to refill? 

## 2013-07-22 NOTE — Telephone Encounter (Signed)
Alprazolam 1 mg Take one tablet by mouth at bedtime

## 2013-07-23 MED ORDER — ALPRAZOLAM 1 MG PO TABS: 1.0000 mg | ORAL_TABLET | Freq: Every evening | ORAL | Status: DC | PRN

## 2013-07-23 NOTE — Addendum Note (Signed)
Addended by: Elvina Mattes T on: 07/23/2013 04:53 PM   Modules accepted: Orders

## 2013-07-23 NOTE — Telephone Encounter (Signed)
Med refilled.

## 2013-08-14 ENCOUNTER — Ambulatory Visit: Payer: BC Managed Care – PPO

## 2013-09-17 ENCOUNTER — Ambulatory Visit
Admission: RE | Admit: 2013-09-17 | Discharge: 2013-09-17 | Disposition: A | Discharge status: Home / Self Care | Payer: BC Managed Care – PPO | Source: Ambulatory Visit

## 2013-09-17 DIAGNOSIS — Z1231 Encounter for screening mammogram for malignant neoplasm of breast: Secondary | ICD-10-CM

## 2013-09-20 ENCOUNTER — Telehealth: Payer: Self-pay | Admitting: *Deleted

## 2013-09-20 NOTE — Telephone Encounter (Signed)
Ok to refill 

## 2013-09-20 NOTE — Telephone Encounter (Signed)
ok 

## 2013-09-23 MED ORDER — ALPRAZOLAM 1 MG PO TABS: 1.0000 mg | ORAL_TABLET | Freq: Every evening | ORAL | Status: DC | PRN

## 2013-09-23 NOTE — Telephone Encounter (Signed)
Meds refilled.

## 2013-10-08 ENCOUNTER — Telehealth: Payer: Self-pay | Admitting: *Deleted

## 2013-10-08 MED ORDER — IBUPROFEN 800 MG PO TABS: ORAL_TABLET | ORAL | Status: DC

## 2013-10-08 NOTE — Telephone Encounter (Signed)
?   Ok to refill; last refill 05/07/13; last ov 06/10/2013

## 2013-10-08 NOTE — Telephone Encounter (Signed)
Okay to refill? 

## 2013-10-08 NOTE — Telephone Encounter (Signed)
Meds refilled.

## 2013-10-11 ENCOUNTER — Encounter: Payer: Self-pay | Admitting: Family Medicine

## 2013-10-11 ENCOUNTER — Ambulatory Visit (INDEPENDENT_AMBULATORY_CARE_PROVIDER_SITE_OTHER): Payer: BC Managed Care – PPO | Admitting: Family Medicine

## 2013-10-11 VITALS — BP 122/76 | HR 96 | Temp 98.2°F | Resp 18 | Ht 69.75 in | Wt 162.0 lb

## 2013-10-11 DIAGNOSIS — M6740 Ganglion, unspecified site: Secondary | ICD-10-CM

## 2013-10-11 DIAGNOSIS — F329 Major depressive disorder, single episode, unspecified: Secondary | ICD-10-CM

## 2013-10-11 DIAGNOSIS — E673 Hypervitaminosis D: Secondary | ICD-10-CM

## 2013-10-11 DIAGNOSIS — F32A Depression, unspecified: Secondary | ICD-10-CM

## 2013-10-11 DIAGNOSIS — E785 Hyperlipidemia, unspecified: Secondary | ICD-10-CM

## 2013-10-11 DIAGNOSIS — R5381 Other malaise: Secondary | ICD-10-CM

## 2013-10-11 DIAGNOSIS — R5383 Other fatigue: Principal | ICD-10-CM

## 2013-10-11 DIAGNOSIS — F3289 Other specified depressive episodes: Secondary | ICD-10-CM

## 2013-10-11 DIAGNOSIS — M67472 Ganglion, left ankle and foot: Secondary | ICD-10-CM | POA: Insufficient documentation

## 2013-10-11 DIAGNOSIS — R634 Abnormal weight loss: Secondary | ICD-10-CM

## 2013-10-11 MED ORDER — VENLAFAXINE HCL 75 MG PO TABS: 75.0000 mg | ORAL_TABLET | Freq: Two times a day (BID) | ORAL | Status: DC

## 2013-10-11 MED ORDER — NORTRIPTYLINE HCL 25 MG PO CAPS: 50.0000 mg | ORAL_CAPSULE | Freq: Every day | ORAL | Status: DC

## 2013-10-11 NOTE — Assessment & Plan Note (Signed)
She does continue to have some stress over her son however I will check her CBC and thyroid function panel as she does have some weight loss

## 2013-10-11 NOTE — Progress Notes (Signed)
   Subjective:    Patient ID: Traci Pratt, female    DOB: 1966/04/02, 48 y.o.   MRN: [id number]  HPI  Patient here to follow chronic medical problems. She states that her mood has been very good and she is sleeping better with the Xanax at bedtime. She recently got married in November. She has some mild fatigue and does not eat on a regular basis typically only once a day because her appetite is poor therefore she's lost 8 pounds over the past 4 months since her last visit. She does not feel sick per report. She is up-to-date with her mammogram and Pap smear which were all benign. She is due for some fasting labs for her job  She does note that she has a tender spot on her left foot which is been growing in size over the past year that she would like to have evaluated Review of Systems - per above   GEN- denies fatigue, fever, weight loss,weakness, recent illness HEENT- denies eye drainage, change in vision, nasal discharge, CVS- denies chest pain, palpitations RESP- denies SOB, cough, wheeze ABD- denies N/V, change in stools, abd pain GU- denies dysuria, hematuria, dribbling, incontinence MSK- denies joint pain, muscle aches, injury Neuro- denies headache, dizziness, syncope, seizure activity      Objective:   Physical Exam  GEN- NAD, alert and oriented x3, weight loss noted HEENT- PERRL, EOMI, non injected sclera, pink conjunctiva, MMM, oropharynx clear Neck- Supple, no thyromegaly CVS- RRR, no murmur RESP-CTAB EXT- No edema, Left sole foot- along medial aspect cystic lesion felt along tendon, TTP, well circumscribed Pulses- Radial, DP- 2+ Psych- normal affect and mood      Assessment & Plan:

## 2013-10-11 NOTE — Assessment & Plan Note (Signed)
Doing well on meds continue current dose

## 2013-10-11 NOTE — Assessment & Plan Note (Signed)
Based on location and increase in size I wonder if this is a ganglion cyst on her foot I will refer her to podiatry for further evaluation treat

## 2013-10-11 NOTE — Assessment & Plan Note (Signed)
I think this is due to some stress as well as poor diet and she typically only eats one meal a day and is very active on the job. Her weight is still within the healthy range however I'm concerned that she will continue to drop off. I will check some basic labs. Her cancer screening is upto date

## 2013-10-11 NOTE — Patient Instructions (Signed)
Get labs done on Monday At least 2 meals a day Continue meds Referral to podiatry F/U 4 months

## 2013-10-11 NOTE — Assessment & Plan Note (Signed)
Cholesterol to be chest

## 2013-10-11 NOTE — Assessment & Plan Note (Signed)
Recheck level 

## 2013-10-14 LAB — CBC WITH DIFFERENTIAL/PLATELET
BASOS PCT: 1 % (ref 0–1)
Basophils Absolute: 0.1 10*3/uL (ref 0.0–0.1)
EOS ABS: 0.2 10*3/uL (ref 0.0–0.7)
EOS PCT: 4 % (ref 0–5)
HCT: 41.4 % (ref 36.0–46.0)
Hemoglobin: 13.9 g/dL (ref 12.0–15.0)
Lymphocytes Relative: 50 % — ABNORMAL HIGH (ref 12–46)
Lymphs Abs: 2.3 10*3/uL (ref 0.7–4.0)
MCH: 30 pg (ref 26.0–34.0)
MCHC: 33.6 g/dL (ref 30.0–36.0)
MCV: 89.2 fL (ref 78.0–100.0)
MONO ABS: 0.3 10*3/uL (ref 0.1–1.0)
Monocytes Relative: 7 % (ref 3–12)
Neutro Abs: 1.7 10*3/uL (ref 1.7–7.7)
Neutrophils Relative %: 38 % — ABNORMAL LOW (ref 43–77)
Platelets: 355 10*3/uL (ref 150–400)
RBC: 4.64 MIL/uL (ref 3.87–5.11)
RDW: 14 % (ref 11.5–15.5)
WBC: 4.6 10*3/uL (ref 4.0–10.5)

## 2013-10-14 LAB — COMPREHENSIVE METABOLIC PANEL
ALK PHOS: 77 U/L (ref 39–117)
ALT: 15 U/L (ref 0–35)
AST: 20 U/L (ref 0–37)
Albumin: 3.9 g/dL (ref 3.5–5.2)
BUN: 14 mg/dL (ref 6–23)
CALCIUM: 9.1 mg/dL (ref 8.4–10.5)
CO2: 33 mEq/L — ABNORMAL HIGH (ref 19–32)
CREATININE: 0.73 mg/dL (ref 0.50–1.10)
Chloride: 102 mEq/L (ref 96–112)
Glucose, Bld: 82 mg/dL (ref 70–99)
Potassium: 4.8 mEq/L (ref 3.5–5.3)
Sodium: 136 mEq/L (ref 135–145)
Total Bilirubin: 0.5 mg/dL (ref 0.2–1.2)
Total Protein: 6.6 g/dL (ref 6.0–8.3)

## 2013-10-14 LAB — LIPID PANEL
Cholesterol: 176 mg/dL (ref 0–200)
HDL: 60 mg/dL (ref >39)
LDL Cholesterol: 106 mg/dL — ABNORMAL HIGH (ref 0–99)
TRIGLYCERIDES: 49 mg/dL (ref <150)
Total CHOL/HDL Ratio: 2.9 Ratio
VLDL: 10 mg/dL (ref 0–40)

## 2013-10-15 LAB — TSH: TSH: 0.61 u[IU]/mL (ref 0.350–4.500)

## 2013-10-15 LAB — VITAMIN D 25 HYDROXY (VIT D DEFICIENCY, FRACTURES): Vit D, 25-Hydroxy: 34 ng/mL (ref 30–89)

## 2013-10-17 ENCOUNTER — Encounter: Payer: Self-pay | Admitting: *Deleted

## 2013-11-22 ENCOUNTER — Telehealth: Payer: Self-pay | Admitting: *Deleted

## 2013-11-22 MED ORDER — ALPRAZOLAM 1 MG PO TABS: 1.0000 mg | ORAL_TABLET | Freq: Every evening | ORAL | Status: DC | PRN

## 2013-11-22 NOTE — Telephone Encounter (Signed)
Received call requesting refill on Xanax.   Ok to refill??  Last office visit 10/11/2013.  Last refill 09/23/2013.

## 2013-11-22 NOTE — Telephone Encounter (Signed)
Call placed to patient and patient made aware.   Medication called to pharmacy. 

## 2013-11-22 NOTE — Telephone Encounter (Signed)
ok 

## 2013-12-18 ENCOUNTER — Encounter: Payer: Self-pay | Admitting: *Deleted

## 2013-12-18 NOTE — Telephone Encounter (Signed)
This encounter was created in error - please disregard.

## 2013-12-18 NOTE — Telephone Encounter (Signed)
Message copied by Phillips OdorSIX, Ajahnae Rathgeber H on Wed Dec 18, 2013 11:44 AM ------      Message from: Malvin JohnsBULLINS, SUSAN S      Created: Wed Dec 18, 2013 11:00 AM       Patient has questions for her doctor was not very specific       [phone] ------

## 2014-01-15 ENCOUNTER — Other Ambulatory Visit: Payer: Self-pay | Admitting: Family Medicine

## 2014-01-16 NOTE — Telephone Encounter (Signed)
Ok to refill??  Last office visit 10/11/2013.  Last refill 11/22/2013.

## 2014-01-17 NOTE — Telephone Encounter (Signed)
Medication called to pharmacy. 

## 2014-01-17 NOTE — Telephone Encounter (Signed)
OKAY TO REFILL?

## 2014-01-22 ENCOUNTER — Telehealth: Payer: Self-pay | Admitting: *Deleted

## 2014-01-22 NOTE — Telephone Encounter (Signed)
Message copied by Phillips OdorSIX, CHRISTINA H on Wed Jan 22, 2014 12:02 PM ------      Message from: Malvin JohnsBULLINS, SUSAN S      Created: Wed Jan 22, 2014 10:56 AM       [phone]      Patient calling to ask questions about her fmla papers and her return date to work  ------

## 2014-01-22 NOTE — Telephone Encounter (Signed)
LMTRC

## 2014-01-22 NOTE — Telephone Encounter (Signed)
She has appt 6/1, she will get release to go back on 6/2 at that time

## 2014-01-22 NOTE — Telephone Encounter (Signed)
MD please advise

## 2014-01-27 NOTE — Telephone Encounter (Signed)
Call placed to patient and patient made aware.  

## 2014-02-04 ENCOUNTER — Telehealth: Payer: Self-pay | Admitting: *Deleted

## 2014-02-04 NOTE — Telephone Encounter (Signed)
Call returned to patient.   States that she would like to extend leave and would like to discuss with MD.   Algis Downs that she currently has leave until her 4 month F/U on 02/10/2014. States that she will discuss extending leave with MD at that time.

## 2014-02-04 NOTE — Telephone Encounter (Signed)
Message copied by Phillips Odor on Tue Feb 04, 2014  2:27 PM ------      Message from: Harriet Masson      Created: Tue Feb 04, 2014  2:10 PM      Contact: [phone]       Cyriah is needing to speak to Dr Jeanice Lim about her leave? ------

## 2014-02-04 NOTE — Telephone Encounter (Signed)
Noted, needs to discuss at visit for documentation

## 2014-02-10 ENCOUNTER — Encounter: Payer: Self-pay | Admitting: Family Medicine

## 2014-02-10 ENCOUNTER — Ambulatory Visit (INDEPENDENT_AMBULATORY_CARE_PROVIDER_SITE_OTHER): Payer: BC Managed Care – PPO | Admitting: Family Medicine

## 2014-02-10 VITALS — BP 138/80 | HR 68 | Temp 98.0°F | Resp 16 | Ht 70.0 in | Wt 163.0 lb

## 2014-02-10 DIAGNOSIS — F329 Major depressive disorder, single episode, unspecified: Secondary | ICD-10-CM

## 2014-02-10 DIAGNOSIS — F3289 Other specified depressive episodes: Secondary | ICD-10-CM

## 2014-02-10 DIAGNOSIS — G4700 Insomnia, unspecified: Secondary | ICD-10-CM

## 2014-02-10 DIAGNOSIS — F419 Anxiety disorder, unspecified: Secondary | ICD-10-CM

## 2014-02-10 DIAGNOSIS — F32A Depression, unspecified: Secondary | ICD-10-CM

## 2014-02-10 DIAGNOSIS — F411 Generalized anxiety disorder: Secondary | ICD-10-CM

## 2014-02-10 MED ORDER — SUVOREXANT 10 MG PO TABS: 1.0000 | ORAL_TABLET | Freq: Every day | ORAL | Status: DC

## 2014-02-10 MED ORDER — ALPRAZOLAM 1 MG PO TABS: 1.0000 mg | ORAL_TABLET | Freq: Two times a day (BID) | ORAL | Status: DC | PRN

## 2014-02-10 MED ORDER — VENLAFAXINE HCL 75 MG PO TABS: 75.0000 mg | ORAL_TABLET | Freq: Two times a day (BID) | ORAL | Status: DC

## 2014-02-10 MED ORDER — NORTRIPTYLINE HCL 25 MG PO CAPS: 50.0000 mg | ORAL_CAPSULE | Freq: Every day | ORAL | Status: DC

## 2014-02-10 NOTE — Assessment & Plan Note (Signed)
Continue effexor at current dose  Will increase FMLA to [place] return to work date, husband requires further assistance s/p stroke

## 2014-02-10 NOTE — Assessment & Plan Note (Signed)
Controlled current medications, takes 1/2 tablet of xanax in AM

## 2014-02-10 NOTE — Assessment & Plan Note (Signed)
Trial of Belsomra 10mg  at bedtime, hold xanax with trial

## 2014-02-10 NOTE — Patient Instructions (Signed)
Start sleeping medication at bedtime- Belsomra- call if this works then new script will be sent in Continue all other medication New return to work will be June 15th. F/U 4 months

## 2014-02-10 NOTE — Progress Notes (Signed)
Patient ID: Traci Pratt, female   DOB: 09-10-1966, 48 y.o.   MRN: [id number]   Subjective:    Patient ID: Traci Pratt, female    DOB: [birth year], 48 y.o.   MRN: [id number]  Patient presents for Follow-up  patient here to followup she's currently out FMLA, secondary to her husband having a stroke. She's requesting extension because of his continued difficulties. She continues to have to drive him as he has not been cleared to drive she also has helped her with dressing and meal preparation. He is still in physical therapy 2 times a week. He is also still following up with specialists including neurology and cardiology and myself Overall she thinks she is handling things well. She continues to have difficulty with sleep which is been long-term for her. She's taken alprazolam however some nights she still does not sleep very well. She is doing well on her Effexor    Review Of Systems:  GEN- denies fatigue, fever, weight loss,weakness, recent illness HEENT- denies eye drainage, change in vision, nasal discharge, CVS- denies chest pain, palpitations RESP- denies SOB, cough, wheeze ABD- denies N/V, change in stools, abd pain GU- denies dysuria, hematuria, dribbling, incontinence MSK- denies joint pain, muscle aches, injury Neuro- denies headache, dizziness, syncope, seizure activity       Objective:    BP 138/80  Pulse 68  Temp(Src) 98 F (36.7 C)  Resp 16  Ht 5\' 10"  (1.778 m)  Wt 163 lb (73.936 kg)  BMI 23.39 kg/m2 GEN- NAD, alert and oriented x3 CVS- RRR, no murmur RESP-CTAB Psych- normal affect and mood        Assessment & Plan:      Problem List Items Addressed This Visit   None      Note: This dictation was prepared with Dragon dictation along with smaller phrase technology. Any transcriptional errors that result from this process are unintentional.

## 2014-04-28 ENCOUNTER — Telehealth: Payer: Self-pay | Admitting: Family Medicine

## 2014-04-28 MED ORDER — NORTRIPTYLINE HCL 25 MG PO CAPS: 75.0000 mg | ORAL_CAPSULE | Freq: Every day | ORAL | Status: DC

## 2014-04-28 NOTE — Telephone Encounter (Signed)
[phone] After 3:00 call her if possible  Patient is calling to speak with someone regarding her nortriptyline dosage and she is about to run out of this

## 2014-04-28 NOTE — Telephone Encounter (Signed)
Call placed to patient.   States that with stress of husband's stroke, she had increased her Pamelor to 75mg  (3 tabs) at night and she is running out of meds.   MD made aware and approved refill with increased quantity.   Prescription sent to pharmacy.

## 2014-06-09 ENCOUNTER — Other Ambulatory Visit: Payer: Self-pay | Admitting: Family Medicine

## 2014-06-09 NOTE — Telephone Encounter (Signed)
ok 

## 2014-06-09 NOTE — Telephone Encounter (Signed)
Ok to refill??  Last office visit/ refill 02/10/2014, #45, 3 refills.

## 2014-06-10 NOTE — Telephone Encounter (Signed)
rx called in

## 2014-06-13 ENCOUNTER — Encounter: Payer: Self-pay | Admitting: Family Medicine

## 2014-06-13 ENCOUNTER — Ambulatory Visit: Payer: BC Managed Care – PPO | Admitting: Family Medicine

## 2014-06-13 ENCOUNTER — Ambulatory Visit (INDEPENDENT_AMBULATORY_CARE_PROVIDER_SITE_OTHER): Payer: BC Managed Care – PPO | Admitting: Family Medicine

## 2014-06-13 VITALS — BP 110/80 | HR 80 | Temp 97.8°F | Resp 20 | Ht 70.0 in | Wt 162.0 lb

## 2014-06-13 DIAGNOSIS — F329 Major depressive disorder, single episode, unspecified: Secondary | ICD-10-CM

## 2014-06-13 DIAGNOSIS — F419 Anxiety disorder, unspecified: Secondary | ICD-10-CM

## 2014-06-13 DIAGNOSIS — G4700 Insomnia, unspecified: Secondary | ICD-10-CM

## 2014-06-13 DIAGNOSIS — F32A Depression, unspecified: Secondary | ICD-10-CM

## 2014-06-13 MED ORDER — VENLAFAXINE HCL 100 MG PO TABS: 100.0000 mg | ORAL_TABLET | Freq: Two times a day (BID) | ORAL | Status: DC

## 2014-06-13 NOTE — Patient Instructions (Signed)
Effexor increased to 100mg  twice day  Continue the xanax 1mg  twice a day as needed  I will f/u via phone in 6 weeks F/U 4 months

## 2014-06-14 NOTE — Assessment & Plan Note (Signed)
We have tried multiple medications including the new Belsomra, she will continue on her current meds  Note weight is unchanged despite poor appetite, discussed importance of eating at least small meals

## 2014-06-14 NOTE — Progress Notes (Signed)
Patient ID: Traci Pratt [place], female   DOB: 1965-10-22, 48 y.o.   MRN: [id number]   Subjective:    Patient ID: Traci Pratt [place], female    DOB: 1965-10-22, 48 y.o.   MRN: [id number]  Patient presents for 4 mos f/u Pt here for f/u, continues to have a significant amount of stress, due to husband out of work for stroke, her son is also back at home and they continue to have difficulty with him. Her sleep is improved some but still not where she wants it to be. Her appetite is also still on and off, she tends to eat only 1 meal a day. Denies SI. Husband states she has had a panic attack in her sleep    Review Of Systems:  GEN- denies fatigue, fever, weight loss,weakness, recent illness HEENT- denies eye drainage, change in vision, nasal discharge, CVS- denies chest pain, palpitations RESP- denies SOB, cough, wheeze ABD- denies N/V, change in stools, abd pain GU- denies dysuria, hematuria, dribbling, incontinence MSK- denies joint pain, muscle aches, injury Neuro- denies headache, dizziness, syncope, seizure activity       Objective:    BP 110/80  Pulse 80  Temp(Src) 97.8 F (36.6 C) (Oral)  Resp 20  Ht 5\' 10"  (1.778 m)  Wt 162 lb (73.483 kg)  BMI 23.24 kg/m2 GEN- NAD, alert and oriented x3 CVS- RRR, no murmur RESP-CTAB Psych- normal affect and mood, no SI, no Hallucinations Pulses- Radial, DP- 2+        Assessment & Plan:      Problem List Items Addressed This Visit   None      Note: This dictation was prepared with Dragon dictation along with smaller phrase technology. Any transcriptional errors that result from this process are unintentional.

## 2014-06-14 NOTE — Assessment & Plan Note (Signed)
Increase effexor to 100mg  BID Continue xanax F/u via phone in 4 weeks No red flags

## 2014-06-30 ENCOUNTER — Telehealth: Payer: Self-pay | Admitting: Family Medicine

## 2014-06-30 NOTE — Telephone Encounter (Signed)
Call placed to patient.   Reports that when she picked up her prescription, the quantity was for #30.  Call placed to pharmacy.   Was advised that prescription was called in with direction to take 1 tab po qd with quantity of #45.   Pharmacist stated that #30 was given out as sufficient amount to fill for those directions [facility]   Prescription re-called into pharmacy with correct directions and quantity.

## 2014-06-30 NOTE — Telephone Encounter (Signed)
Patient left message to speak to you regarding her xanax regarding the quantity, she says she is not getting prescribed enough  Please call her back at [phone] [facility] [place]

## 2014-10-02 ENCOUNTER — Other Ambulatory Visit: Payer: Self-pay | Admitting: Family Medicine

## 2014-10-06 NOTE — Telephone Encounter (Signed)
OKAY TO REFILL?

## 2014-10-06 NOTE — Telephone Encounter (Signed)
rx called in

## 2014-10-06 NOTE — Telephone Encounter (Signed)
?   OK to Refill - LOV 06/13/14 Last refill - 06/30/14 #45

## 2014-10-15 ENCOUNTER — Encounter: Payer: Self-pay | Admitting: Family Medicine

## 2014-10-15 ENCOUNTER — Ambulatory Visit (INDEPENDENT_AMBULATORY_CARE_PROVIDER_SITE_OTHER): Payer: BLUE CROSS/BLUE SHIELD | Admitting: Family Medicine

## 2014-10-15 VITALS — BP 112/76 | HR 68 | Temp 97.9°F | Resp 14 | Ht 70.0 in | Wt 163.0 lb

## 2014-10-15 DIAGNOSIS — F32A Depression, unspecified: Secondary | ICD-10-CM

## 2014-10-15 DIAGNOSIS — G4700 Insomnia, unspecified: Secondary | ICD-10-CM

## 2014-10-15 DIAGNOSIS — H04129 Dry eye syndrome of unspecified lacrimal gland: Secondary | ICD-10-CM | POA: Insufficient documentation

## 2014-10-15 DIAGNOSIS — H04123 Dry eye syndrome of bilateral lacrimal glands: Secondary | ICD-10-CM

## 2014-10-15 DIAGNOSIS — E785 Hyperlipidemia, unspecified: Secondary | ICD-10-CM

## 2014-10-15 DIAGNOSIS — F419 Anxiety disorder, unspecified: Secondary | ICD-10-CM

## 2014-10-15 DIAGNOSIS — F329 Major depressive disorder, single episode, unspecified: Secondary | ICD-10-CM

## 2014-10-15 LAB — COMPREHENSIVE METABOLIC PANEL
ALT: 16 U/L (ref 0–35)
AST: 15 U/L (ref 0–37)
Albumin: 3.6 g/dL (ref 3.5–5.2)
Alkaline Phosphatase: 69 U/L (ref 39–117)
BUN: 11 mg/dL (ref 6–23)
CHLORIDE: 103 meq/L (ref 96–112)
CO2: 29 mEq/L (ref 19–32)
Calcium: 9.3 mg/dL (ref 8.4–10.5)
Creat: 0.74 mg/dL (ref 0.50–1.10)
GLUCOSE: 63 mg/dL — AB (ref 70–99)
POTASSIUM: 4 meq/L (ref 3.5–5.3)
SODIUM: 141 meq/L (ref 135–145)
Total Bilirubin: 0.5 mg/dL (ref 0.2–1.2)
Total Protein: 6.5 g/dL (ref 6.0–8.3)

## 2014-10-15 LAB — LIPID PANEL
Cholesterol: 193 mg/dL (ref 0–200)
HDL: 66 mg/dL (ref >39)
LDL Cholesterol: 115 mg/dL — ABNORMAL HIGH (ref 0–99)
Total CHOL/HDL Ratio: 2.9 Ratio
Triglycerides: 61 mg/dL (ref <150)
VLDL: 12 mg/dL (ref 0–40)

## 2014-10-15 NOTE — Assessment & Plan Note (Signed)
Continue current medications. 

## 2014-10-15 NOTE — Patient Instructions (Signed)
Schedule eye appt We will call with lab results Continue current medications F/U 6 months Physical

## 2014-10-15 NOTE — Progress Notes (Signed)
Patient ID: Traci Pratt, female   DOB: 09-22-1965, 49 y.o.   MRN: [id number]   Subjective:    Patient ID: Traci Pratt, female    DOB: 09-22-1965, 49 y.o.   MRN: [id number]  Patient presents for 4 month F/U  Pt here to f/u chronic medical problems, she is doing well on her medications, she is sleeping fairly well and maintaining her weight. She continues to have problems with her son which causes her to be very upset, she feels like she can not reach him. She has minimal contact with her grandson. She does have watering eyes a lot , some times she feels like they are dry, her vision has also changed, she has not scheduled with her eye doctor to see if glasses need to be adjusted     Review Of Systems:  GEN- denies fatigue, fever, weight loss,weakness, recent illness HEENT- denies eye drainage, +change in vision, nasal discharge, CVS- denies chest pain, palpitations RESP- denies SOB, cough, wheeze ABD- denies N/V, change in stools, abd pain GU- denies dysuria, hematuria, dribbling, incontinence MSK- denies joint pain, muscle aches, injury Neuro- denies headache, dizziness, syncope, seizure activity       Objective:    BP 112/76 mmHg  Pulse 68  Temp(Src) 97.9 F (36.6 C) (Oral)  Resp 14  Ht 5\' 10"  (1.778 m)  Wt 163 lb (73.936 kg)  BMI 23.39 kg/m2 GEN- NAD, alert and oriented x3 HEENT- PERRL, EOMI, non injected sclera, pink conjunctiva, MMM, oropharynx clear, fundus benign Neck- Supple, no thyromegaly CVS- RRR, no murmur RESP-CTAB Psych- tearful discussing son, not anxious appearing, well groomed, normal thought process and judgment EXT- No edema Pulses- Radial 2+        Assessment & Plan:      Problem List Items Addressed This Visit      Unprioritized   Insomnia - Primary   Hyperlipidemia   Relevant Orders   Lipid panel   Comprehensive metabolic panel   Depression   Anxiety      Note: This dictation was prepared with Dragon dictation  along with smaller phrase technology. Any transcriptional errors that result from this process are unintentional.

## 2014-10-15 NOTE — Assessment & Plan Note (Signed)
Check FLP 

## 2014-10-15 NOTE — Assessment & Plan Note (Signed)
OTC artificial tears until her appt with her eye doctor to have eyes re-examined and glasses changed

## 2014-10-16 ENCOUNTER — Encounter: Payer: Self-pay | Admitting: Family Medicine

## 2014-10-17 ENCOUNTER — Encounter: Payer: Self-pay | Admitting: *Deleted

## 2014-11-24 ENCOUNTER — Encounter: Payer: Self-pay | Admitting: Family Medicine

## 2014-11-27 ENCOUNTER — Other Ambulatory Visit: Payer: Self-pay | Admitting: Family Medicine

## 2014-11-28 NOTE — Telephone Encounter (Signed)
Refill appropriate and filled per protocol. 

## 2014-12-25 ENCOUNTER — Other Ambulatory Visit: Payer: Self-pay | Admitting: Family Medicine

## 2014-12-25 NOTE — Telephone Encounter (Signed)
Ok to refill??  Last office visit 2/3/20126.  Last refill 10/06/2014, #2 refills.

## 2014-12-26 NOTE — Telephone Encounter (Signed)
Medication called to pharmacy. 

## 2014-12-26 NOTE — Telephone Encounter (Signed)
Okay to refill? 

## 2014-12-31 ENCOUNTER — Encounter: Payer: Self-pay | Admitting: Family Medicine

## 2015-01-23 ENCOUNTER — Telehealth: Payer: Self-pay | Admitting: Family Medicine

## 2015-01-23 MED ORDER — VENLAFAXINE HCL 100 MG PO TABS: 100.0000 mg | ORAL_TABLET | Freq: Two times a day (BID) | ORAL | Status: DC

## 2015-01-23 NOTE — Telephone Encounter (Signed)
Prescription sent to pharmacy.

## 2015-01-23 NOTE — Telephone Encounter (Signed)
[phone]  [facility] [place] Pt is needing a refill on her venlafaxine (EFFEXOR) 100 MG tablet

## 2015-02-23 ENCOUNTER — Telehealth: Payer: Self-pay | Admitting: Family Medicine

## 2015-02-23 MED ORDER — VENLAFAXINE HCL 100 MG PO TABS: 100.0000 mg | ORAL_TABLET | Freq: Two times a day (BID) | ORAL | Status: DC

## 2015-02-23 MED ORDER — ALPRAZOLAM 1 MG PO TABS: 1.0000 mg | ORAL_TABLET | Freq: Two times a day (BID) | ORAL | Status: DC | PRN

## 2015-02-23 NOTE — Telephone Encounter (Signed)
MD please advise

## 2015-02-23 NOTE — Telephone Encounter (Signed)
Patient called in states that she is aware that she isn't a patient here until she pays her account in full. She called today to say that she is the only one in her household that is working and she is going to come by and pay on account.  She is requesting refill on two of her medications, [facility] [place] [address] venlafaxine (EFFEXOR) 100 MG tablet and ALPRAZolam (XANAX) 1 MG tablet

## 2015-02-23 NOTE — Telephone Encounter (Signed)
Medication called to pharmacy.  Call placed to patient and patient made aware per VM.  

## 2015-02-23 NOTE — Telephone Encounter (Signed)
Okay to refill give 2 months

## 2015-03-16 ENCOUNTER — Other Ambulatory Visit: Payer: Self-pay | Admitting: Family Medicine

## 2015-03-17 NOTE — Telephone Encounter (Signed)
Patient has been discharged from practice.  Medication refill denied 

## 2015-04-15 ENCOUNTER — Ambulatory Visit: Payer: BLUE CROSS/BLUE SHIELD | Admitting: Family Medicine

## 2015-05-26 ENCOUNTER — Telehealth: Payer: Self-pay | Admitting: Family Medicine

## 2015-05-26 MED ORDER — VENLAFAXINE HCL 100 MG PO TABS: 100.0000 mg | ORAL_TABLET | Freq: Two times a day (BID) | ORAL | Status: DC

## 2015-05-26 NOTE — Telephone Encounter (Signed)
One month prescription sent to pharmacy.

## 2015-05-26 NOTE — Telephone Encounter (Signed)
Patient calling to say that she is having the pharmacy fax over her medications she is out of her effexor and it is making her sick, and the patient is making payments on her account!

## 2015-06-03 ENCOUNTER — Other Ambulatory Visit: Payer: Self-pay | Admitting: Family Medicine

## 2015-06-03 NOTE — Telephone Encounter (Signed)
Reviewed problem list and medications prescribed by Dr. Jeanice Lim. Agree, patient due for routine office visit. Schedule office visit. Can refill medications then.

## 2015-06-03 NOTE — Telephone Encounter (Signed)
?   OK to Refill LOV 10/15/14 - L[facility] - 02/23/15 #45/3 - pt is due to OV for 6 mnth f/u

## 2015-06-05 ENCOUNTER — Telehealth: Payer: Self-pay | Admitting: Family Medicine

## 2015-06-05 MED ORDER — NORTRIPTYLINE HCL 25 MG PO CAPS: 75.0000 mg | ORAL_CAPSULE | Freq: Every day | ORAL | Status: DC

## 2015-06-05 NOTE — Telephone Encounter (Signed)
nortriptyline (PAMELOR) 25 MG capsule and she is also asking about xanax which she says she can wait on but it looks like MB addressed this question on 06/03/15.

## 2015-06-05 NOTE — Telephone Encounter (Signed)
Xanax will have to wait until after visit.   pamelor sent to pharmacy.

## 2015-06-08 ENCOUNTER — Ambulatory Visit (INDEPENDENT_AMBULATORY_CARE_PROVIDER_SITE_OTHER): Payer: Self-pay | Admitting: Family Medicine

## 2015-06-08 ENCOUNTER — Encounter: Payer: Self-pay | Admitting: Family Medicine

## 2015-06-08 VITALS — BP 128/80 | HR 82 | Temp 98.0°F | Resp 16 | Ht 70.0 in | Wt 158.0 lb

## 2015-06-08 DIAGNOSIS — R238 Other skin changes: Secondary | ICD-10-CM

## 2015-06-08 DIAGNOSIS — J310 Chronic rhinitis: Secondary | ICD-10-CM | POA: Insufficient documentation

## 2015-06-08 DIAGNOSIS — R233 Spontaneous ecchymoses: Secondary | ICD-10-CM | POA: Insufficient documentation

## 2015-06-08 DIAGNOSIS — F329 Major depressive disorder, single episode, unspecified: Secondary | ICD-10-CM

## 2015-06-08 DIAGNOSIS — G4700 Insomnia, unspecified: Secondary | ICD-10-CM

## 2015-06-08 DIAGNOSIS — F419 Anxiety disorder, unspecified: Secondary | ICD-10-CM

## 2015-06-08 DIAGNOSIS — F32A Depression, unspecified: Secondary | ICD-10-CM

## 2015-06-08 DIAGNOSIS — J309 Allergic rhinitis, unspecified: Secondary | ICD-10-CM

## 2015-06-08 MED ORDER — ALPRAZOLAM 1 MG PO TABS: 1.0000 mg | ORAL_TABLET | Freq: Two times a day (BID) | ORAL | Status: DC | PRN

## 2015-06-08 NOTE — Patient Instructions (Signed)
Prescription mask given Continue current medications We will call with  Lab results F/U 6 months- Physical

## 2015-06-08 NOTE — Progress Notes (Signed)
Patient ID: Traci Pratt, female   DOB: [birth year], 49 y.o.   MRN: [id number]   Subjective:    Patient ID: Traci Pratt, female    DOB: 11-15-1965, 49 y.o.   MRN: [id number]  Patient presents for F/U  a shear for follow-up on her medications. She's had some changes with her job recently and her nerves have been very stressed out. She is feeling one working now in her household. She was actually quite upset because we declined continue to fill her medication as she was unable to come in for an appointment and she has had an outstanding bill for quite some time. She is still on her Effexor and her Pamelor. She does take the Xanax she takes it twice a day and helps keeps her nerves at bay she's been out of this medication.  She also requested a note to have a ventilatory mask at her new job. She works in [employer] with there is a lot of flour and other ingredients that cause her to have drainage from her nose as well as a mild cough there are also chemicals around that they spray at nighttime to keep the facility cleaning that seem to irritate her nose and throat. She states that many people on the job have this issue. She has a regular paper mask but would like a ventilatory mask if possible.  She also notes that she has been getting some bruising from the heavy boxes that she has to move at work no knots but just some bruising on her legs and occasionally her arms. She states that she's always had easy bruising. She was worried about a blood clot  Review Of Systems:  GEN- denies fatigue, fever, weight loss,weakness, recent illness HEENT- denies eye drainage, change in vision, +nasal discharge, CVS- denies chest pain, palpitations RESP- denies SOB, +cough, wheeze ABD- denies N/V, change in stools, abd pain GU- denies dysuria, hematuria, dribbling, incontinence MSK- denies joint pain, muscle aches, injury Neuro- denies headache, dizziness, syncope, seizure  activity       Objective:    BP 128/80 mmHg  Pulse 82  Temp(Src) 98 F (36.7 C) (Oral)  Resp 16  Ht  (1.778 m)  Wt 158 lb (71.668 kg)  BMI 22.67 kg/m2 GEN- NAD, alert and oriented x3 HEENT- PERRL, EOMI, non injected sclera, pink conjunctiva, MMM, oropharynx clear, nares clear  Neck- Supple, no thyromegaly CVS- RRR, no murmur RESP-CTAB Psych- Stressed appearing, not depressed, no SI, well groomed, good eye contact  Skin- faded small bruises on bilat legs, 1 small bruise on right forearm, no hematoma Pulses- Radial,  2+        Assessment & Plan:      Problem List Items Addressed This Visit    Rhinitis   Insomnia   Easy bruising - Primary    No abnormal rash characteristics to the mild bruising. She's not had any significant anemia in the past. She is unable to get a PT/INR today. No signs of any other abnormal bleeding we will just monitor for now as she states that she's always been an easy bruiser.      Depression    Persistent treatment for major depression as well as anxiety and insomnia. She has a lot of stressors on the job as well as in her home life. Chronic continue the nortriptyline at 75 mg she will also continue her Effexor and I will change her Xanax so that she can take this twice a  day and increase her from 45-60 tablets. I see no signs of abuse of the medication. She is now on a new job discharge to settle in. With regards to the rhinitis I think this is from the exposures that she has much LOWER in the ingredients in the air is causing some irritation there for many give her prescription for the ventilatory mask which can be provided at work.  She is currently uninsured therefore we wait and talk next visit to get her labs      Relevant Medications   ALPRAZolam (XANAX) 1 MG tablet   Anxiety   Relevant Medications   ALPRAZolam (XANAX) 1 MG tablet      Note: This dictation was prepared with Dragon dictation along with smaller phrase technology.  Any transcriptional errors that result from this process are unintentional.

## 2015-06-09 NOTE — Assessment & Plan Note (Signed)
Persistent treatment for major depression as well as anxiety and insomnia. She has a lot of stressors on the job as well as in her home life. Chronic continue the nortriptyline at 75 mg she will also continue her Effexor and I will change her Xanax so that she can take this twice a day and increase her from 45-60 tablets. I see no signs of abuse of the medication. She is now on a new job discharge to settle in. With regards to the rhinitis I think this is from the exposures that she has much LOWER in the ingredients in the air is causing some irritation there for many give her prescription for the ventilatory mask which can be provided at work.  She is currently uninsured therefore we wait and talk next visit to get her labs

## 2015-06-09 NOTE — Assessment & Plan Note (Signed)
No abnormal rash characteristics to the mild bruising. She's not had any significant anemia in the past. She is unable to get a PT/INR today. No signs of any other abnormal bleeding we will just monitor for now as she states that she's always been an easy bruiser.

## 2015-06-30 ENCOUNTER — Other Ambulatory Visit: Payer: Self-pay | Admitting: Family Medicine

## 2015-06-30 NOTE — Telephone Encounter (Signed)
Refill appropriate and filled per protocol. 

## 2015-09-28 ENCOUNTER — Other Ambulatory Visit: Payer: Self-pay | Admitting: Family Medicine

## 2015-09-29 ENCOUNTER — Other Ambulatory Visit: Payer: Self-pay | Admitting: Family Medicine

## 2015-09-29 NOTE — Telephone Encounter (Signed)
Medication called to pharmacy. 

## 2015-09-29 NOTE — Telephone Encounter (Signed)
Refill appropriate and filled per protocol. 

## 2015-09-29 NOTE — Telephone Encounter (Signed)
Ok to refill??  Last office visit/ refill 06/08/2015, #3 refills.

## 2015-09-29 NOTE — Telephone Encounter (Signed)
ok 

## 2015-12-07 ENCOUNTER — Encounter: Payer: Self-pay | Admitting: Family Medicine

## 2015-12-15 ENCOUNTER — Encounter: Payer: Self-pay | Admitting: Family Medicine

## 2015-12-15 ENCOUNTER — Ambulatory Visit (INDEPENDENT_AMBULATORY_CARE_PROVIDER_SITE_OTHER): Payer: BLUE CROSS/BLUE SHIELD | Admitting: Family Medicine

## 2015-12-15 VITALS — BP 128/70 | HR 72 | Temp 98.6°F | Resp 14 | Ht 70.0 in | Wt 164.0 lb

## 2015-12-15 DIAGNOSIS — R5382 Chronic fatigue, unspecified: Secondary | ICD-10-CM

## 2015-12-15 DIAGNOSIS — Z124 Encounter for screening for malignant neoplasm of cervix: Secondary | ICD-10-CM | POA: Diagnosis not present

## 2015-12-15 DIAGNOSIS — Z0000 Encounter for general adult medical examination without abnormal findings: Secondary | ICD-10-CM | POA: Diagnosis not present

## 2015-12-15 DIAGNOSIS — E785 Hyperlipidemia, unspecified: Secondary | ICD-10-CM

## 2015-12-15 DIAGNOSIS — F329 Major depressive disorder, single episode, unspecified: Secondary | ICD-10-CM

## 2015-12-15 DIAGNOSIS — F419 Anxiety disorder, unspecified: Secondary | ICD-10-CM

## 2015-12-15 DIAGNOSIS — Z1239 Encounter for other screening for malignant neoplasm of breast: Secondary | ICD-10-CM | POA: Diagnosis not present

## 2015-12-15 DIAGNOSIS — F32A Depression, unspecified: Secondary | ICD-10-CM

## 2015-12-15 DIAGNOSIS — Z113 Encounter for screening for infections with a predominantly sexual mode of transmission: Secondary | ICD-10-CM | POA: Diagnosis not present

## 2015-12-15 LAB — WET PREP FOR TRICH, YEAST, CLUE
CLUE CELLS WET PREP: NONE SEEN
TRICH WET PREP: NONE SEEN
Yeast Wet Prep HPF POC: NONE SEEN

## 2015-12-15 LAB — COMPREHENSIVE METABOLIC PANEL
ALBUMIN: 4 g/dL (ref 3.6–5.1)
ALK PHOS: 80 U/L (ref 33–115)
ALT: 18 U/L (ref 6–29)
AST: 16 U/L (ref 10–35)
BILIRUBIN TOTAL: 0.5 mg/dL (ref 0.2–1.2)
BUN: 9 mg/dL (ref 7–25)
CO2: 32 mmol/L — AB (ref 20–31)
CREATININE: 0.77 mg/dL (ref 0.50–1.10)
Calcium: 9.1 mg/dL (ref 8.6–10.2)
Chloride: 102 mmol/L (ref 98–110)
GLUCOSE: 82 mg/dL (ref 70–99)
Potassium: 4 mmol/L (ref 3.5–5.3)
SODIUM: 140 mmol/L (ref 135–146)
Total Protein: 6.4 g/dL (ref 6.1–8.1)

## 2015-12-15 LAB — CBC WITH DIFFERENTIAL/PLATELET
BASOS PCT: 0 %
Basophils Absolute: 0 cells/uL (ref 0–200)
EOS PCT: 5 %
Eosinophils Absolute: 255 cells/uL (ref 15–500)
HCT: 39.5 % (ref 35.0–45.0)
HEMOGLOBIN: 12.8 g/dL (ref 12.0–15.0)
LYMPHS ABS: 2295 {cells}/uL (ref 850–3900)
Lymphocytes Relative: 45 %
MCH: 29 pg (ref 27.0–33.0)
MCHC: 32.4 g/dL (ref 32.0–36.0)
MCV: 89.4 fL (ref 80.0–100.0)
MONO ABS: 357 {cells}/uL (ref 200–950)
MPV: 10 fL (ref 7.5–12.5)
Monocytes Relative: 7 %
NEUTROS ABS: 2193 {cells}/uL (ref 1500–7800)
NEUTROS PCT: 43 %
Platelets: 307 10*3/uL (ref 140–400)
RBC: 4.42 MIL/uL (ref 3.80–5.10)
RDW: 13.7 % (ref 11.0–15.0)
WBC: 5.1 10*3/uL (ref 3.8–10.8)

## 2015-12-15 LAB — VITAMIN B12: Vitamin B-12: 1076 pg/mL (ref 200–1100)

## 2015-12-15 LAB — LIPID PANEL
Cholesterol: 187 mg/dL (ref 125–200)
HDL: 58 mg/dL (ref >=46)
LDL CALC: 98 mg/dL (ref <130)
Total CHOL/HDL Ratio: 3.2 Ratio (ref <=5.0)
Triglycerides: 156 mg/dL — ABNORMAL HIGH (ref <150)
VLDL: 31 mg/dL — ABNORMAL HIGH (ref <30)

## 2015-12-15 LAB — TSH: TSH: 2.04 mIU/L

## 2015-12-15 MED ORDER — ALPRAZOLAM 1 MG PO TABS: ORAL_TABLET | ORAL | Status: DC

## 2015-12-15 MED ORDER — VENLAFAXINE HCL 100 MG PO TABS: 100.0000 mg | ORAL_TABLET | Freq: Two times a day (BID) | ORAL | Status: DC

## 2015-12-15 MED ORDER — DOCUSATE SODIUM 100 MG PO CAPS: 100.0000 mg | ORAL_CAPSULE | Freq: Two times a day (BID) | ORAL | Status: DC | PRN

## 2015-12-15 MED ORDER — NORTRIPTYLINE HCL 25 MG PO CAPS: 75.0000 mg | ORAL_CAPSULE | Freq: Every day | ORAL | Status: DC

## 2015-12-15 NOTE — Progress Notes (Signed)
Patient ID: Traci Pratt, female   DOB: Mar 26, 1966, 50 y.o.   MRN: [id number]   Subjective:    Patient ID: Traci Pratt, female    DOB: 1966-07-01, 50 y.o.   MRN: [id number]  Patient presents for CPE with PAP Issue here for complete physical exam with Pap smear. Her immunizations are up-to-date. Her last Pap smear was in 2013. She is due for mammogram. Her medications were reviewed. She has complained of chronic fatigue for quite some time. Unfortunately she is a very stressful home situation. Her husband had a stroke 3 years ago and is still not working. Findings of very tight and she feels like the way to the family is on her shoulders. She also has a very difficult relationship with her son and rarely gets to see her grandchild. She does feel like her medications are helping otherwise she would not be a little function. She continues to take her sleeping pill and her Xanax as needed. She wishes that her husband with step up to provide more support or try to look for work and said he is relying on trying to get disability.    Review Of Systems:  GEN- denies fatigue, fever, weight loss,weakness, recent illness HEENT- denies eye drainage, change in vision, nasal discharge, CVS- denies chest pain, palpitations RESP- denies SOB, cough, wheeze ABD- denies N/V, change in stools, abd pain GU- denies dysuria, hematuria, dribbling, incontinence MSK- denies joint pain, muscle aches, injury Neuro- denies headache, dizziness, syncope, seizure activity       Objective:    BP 128/70 mmHg  Pulse 72  Temp(Src) 98.6 F (37 C) (Oral)  Resp 14  Ht  (1.778 m)  Wt 164 lb (74.39 kg)  BMI 23.53 kg/m2 GEN- NAD, alert and oriented x3 HEENT- PERRL, EOMI, non injected sclera, pink conjunctiva, MMM, oropharynx clear Neck- Supple, no thyromegaly CVS- RRR, no murmur RESP-CTAB Breast- normal symmetry, no nipple inversion,no nipple drainage, no nodules or lumps felt Nodes- no  axillary nodes ABD-NABS,soft,NT,ND GU- normal external genitalia, vaginal mucosa pink and moist, cervix visualized no growth, no blood form os, minimal thin clear discharge, no CMT, no ovarian masses, uterus normal size Psych- teaful at times, not anxious, no SI/HI,well groomed ,no hallucinations  EXT- No edema Pulses- Radial, DP- 2+        Assessment & Plan:      Problem List Items Addressed This Visit    Hyperlipidemia - Primary    Recheck fasting labs      Relevant Orders   Lipid panel   Depression    Continue with current dose of Effexor Xanax and nortriptyline. Unfortunately with her husband's work situation this is not an easy thing to solve. She does not seem to be getting very much support at home and is even contemplating separation. I discussed marriage counseling but she does not think that he is willing to go.      Anxiety    Other Visit Diagnoses    Routine general medical examination at a health care facility        Physical done mammogram to be scheduled, Pap smear done. Also screening for STDs.    Relevant Orders    CBC with Differential/Platelet    Comprehensive metabolic panel    Vitamin D, 25-hydroxy    Breast cancer screening        Cervical cancer screening        Relevant Orders    PAP, ThinPrep ASCUS Rflx HPV Rflx Type  Screen for STD (sexually transmitted disease)        Relevant Orders    WET PREP FOR TRICH, YEAST, CLUE (Completed)    GC/Chlamydia Probe Amp    Chronic fatigue        I make her chronic fatigue is due to overwhelming stress that she has at home with her marriage and her family.    Relevant Orders    TSH    Vitamin B12       Note: This dictation was prepared with Dragon dictation along with smaller phrase technology. Any transcriptional errors that result from this process are unintentional.

## 2015-12-15 NOTE — Patient Instructions (Signed)
Continue current medications We will call with lab results Schedule your mammogram F/U 6 months

## 2015-12-15 NOTE — Assessment & Plan Note (Addendum)
Continue with current dose of Effexor Xanax and nortriptyline. Unfortunately with her husband's work situation this is not an easy thing to solve. She does not seem to be getting very much support at home and is even contemplating separation. I discussed marriage counseling but she does not think that he is willing to go.

## 2015-12-15 NOTE — Assessment & Plan Note (Signed)
Recheck fasting labs.  

## 2015-12-16 LAB — PAP THINPREP ASCUS RFLX HPV RFLX TYPE

## 2015-12-16 LAB — GC/CHLAMYDIA PROBE AMP
CT PROBE, AMP APTIMA: NOT DETECTED
GC PROBE AMP APTIMA: NOT DETECTED

## 2015-12-16 LAB — VITAMIN D 25 HYDROXY (VIT D DEFICIENCY, FRACTURES): Vit D, 25-Hydroxy: 26 ng/mL — ABNORMAL LOW (ref 30–100)

## 2016-01-18 ENCOUNTER — Other Ambulatory Visit: Payer: Self-pay | Admitting: *Deleted

## 2016-01-18 DIAGNOSIS — R94120 Abnormal auditory function study: Secondary | ICD-10-CM

## 2016-02-29 ENCOUNTER — Ambulatory Visit (INDEPENDENT_AMBULATORY_CARE_PROVIDER_SITE_OTHER): Payer: Self-pay | Admitting: Otolaryngology

## 2016-04-20 ENCOUNTER — Telehealth: Payer: Self-pay | Admitting: *Deleted

## 2016-04-20 MED ORDER — ALPRAZOLAM 1 MG PO TABS: ORAL_TABLET | ORAL | 3 refills | Status: DC

## 2016-04-20 NOTE — Telephone Encounter (Signed)
Received fax requesting refill on Xanax.  Ok to refill??  Last office visit/ refill 12/15/2015, #3 refills.

## 2016-04-20 NOTE — Telephone Encounter (Signed)
Medication called to pharmacy. 

## 2016-04-20 NOTE — Telephone Encounter (Signed)
Okay to refill? 

## 2016-06-17 ENCOUNTER — Ambulatory Visit: Payer: BLUE CROSS/BLUE SHIELD | Admitting: Family Medicine

## 2016-06-24 ENCOUNTER — Ambulatory Visit: Payer: BLUE CROSS/BLUE SHIELD | Admitting: Family Medicine

## 2016-07-05 ENCOUNTER — Encounter: Payer: Self-pay | Admitting: Family Medicine

## 2016-07-05 ENCOUNTER — Ambulatory Visit (INDEPENDENT_AMBULATORY_CARE_PROVIDER_SITE_OTHER): Payer: BLUE CROSS/BLUE SHIELD | Admitting: Family Medicine

## 2016-07-05 VITALS — BP 118/62 | HR 76 | Temp 98.9°F | Resp 12 | Ht 70.0 in | Wt 166.0 lb

## 2016-07-05 DIAGNOSIS — E559 Vitamin D deficiency, unspecified: Secondary | ICD-10-CM | POA: Diagnosis not present

## 2016-07-05 DIAGNOSIS — F5101 Primary insomnia: Secondary | ICD-10-CM | POA: Diagnosis not present

## 2016-07-05 DIAGNOSIS — E7800 Pure hypercholesterolemia, unspecified: Secondary | ICD-10-CM | POA: Diagnosis not present

## 2016-07-05 DIAGNOSIS — F419 Anxiety disorder, unspecified: Secondary | ICD-10-CM

## 2016-07-05 DIAGNOSIS — Z23 Encounter for immunization: Secondary | ICD-10-CM

## 2016-07-05 DIAGNOSIS — F330 Major depressive disorder, recurrent, mild: Secondary | ICD-10-CM | POA: Diagnosis not present

## 2016-07-05 LAB — LIPID PANEL
CHOL/HDL RATIO: 3.2 ratio (ref <=5.0)
CHOLESTEROL: 192 mg/dL (ref 125–200)
HDL: 60 mg/dL (ref >=46)
LDL Cholesterol: 111 mg/dL (ref <130)
Triglycerides: 104 mg/dL (ref <150)
VLDL: 21 mg/dL (ref <30)

## 2016-07-05 NOTE — Patient Instructions (Addendum)
F/U 6 months  Try sudafed, nasal saline or mucinex DM for any congestion  Schedule eye exam

## 2016-07-05 NOTE — Assessment & Plan Note (Signed)
She does have a lot of stress on her plate issues distal provider for the home but in general she is keeping up. She will continue with her current medication regimen

## 2016-07-05 NOTE — Assessment & Plan Note (Signed)
Recheck lipids, continues to monitor diet, though she is eating a lot of ice cream sandwhiches in the evening

## 2016-07-05 NOTE — Progress Notes (Signed)
   Subjective:    Patient ID: Traci Pratt, female    DOB: [birth year], 50 y.o.   MRN: [id number]  Patient presents for Follow-up (is fasting) Patient here for follow-up on chronic medical problems. She's been treated for her anxiety and depression as well as chronic insomnia. She is on Xanax at bedtime for sleep she is also on Effexor and nortriptyline without any difficulties. She does have history of high cholesterol, her last check 6 months ago her triglycerides were minimally elevated otherwise her lipid panel looked good. She also had mildly low vitamin D but she is supposed be on over-the-counter supplementation for  Due for Flu shot   Review Of Systems:  GEN- denies fatigue, fever, weight loss,weakness, recent illness HEENT- denies eye drainage, change in vision, nasal discharge, CVS- denies chest pain, palpitations RESP- denies SOB, cough, wheeze ABD- denies N/V, change in stools, abd pain GU- denies dysuria, hematuria, dribbling, incontinence MSK- denies joint pain, muscle aches, injury Neuro- denies headache, dizziness, syncope, seizure activity       Objective:    BP 118/62 (BP Location: Left Arm, Patient Position: Sitting, Cuff Size: Normal)   Pulse 76   Temp 98.9 F (37.2 C) (Oral)   Resp 12   Ht 5\' 10"  (1.778 m)   Wt 166 lb (75.3 kg)   BMI 23.82 kg/m  GEN- NAD, alert and oriented x3 HEENT- PERRL, EOMI, non injected sclera, pink conjunctiva, MMM, oropharynx clear Neck- Supple, no thyromegaly CVS- RRR, no murmur RESP-CTAB Psych- normal affect and mood  EXT- No edema Pulses- Radial, 2+        Assessment & Plan:      Problem List Items Addressed This Visit    Vitamin D deficiency   Relevant Orders   Vitamin D, 25-hydroxy   Insomnia    Continue xanax and pamelor      Hyperlipidemia - Primary    Recheck lipids, continues to monitor diet, though she is eating a lot of ice cream sandwhiches in the evening       Relevant Orders   Lipid  panel   Depression    She does have a lot of stress on her plate issues distal provider for the home but in general she is keeping up. She will continue with her current medication regimen      Anxiety    Other Visit Diagnoses    Need for prophylactic vaccination and inoculation against influenza       Relevant Orders   Flu Vaccine QUAD 36+ mos PF IM (Fluarix & Fluzone Quad PF) (Completed)      Note: This dictation was prepared with Dragon dictation along with smaller phrase technology. Any transcriptional errors that result from this process are unintentional.

## 2016-07-05 NOTE — Assessment & Plan Note (Signed)
Continue xanax and pamelor

## 2016-07-06 LAB — VITAMIN D 25 HYDROXY (VIT D DEFICIENCY, FRACTURES): VIT D 25 HYDROXY: 28 ng/mL — AB (ref 30–100)

## 2016-07-14 ENCOUNTER — Encounter: Payer: Self-pay | Admitting: *Deleted

## 2016-08-06 ENCOUNTER — Other Ambulatory Visit: Payer: Self-pay | Admitting: Family Medicine

## 2016-08-13 ENCOUNTER — Other Ambulatory Visit: Payer: Self-pay | Admitting: Family Medicine

## 2016-08-15 NOTE — Telephone Encounter (Signed)
okay

## 2016-08-15 NOTE — Telephone Encounter (Signed)
Ok to refill??  Last office visit 07/05/2016.  Last refill 04/20/2016, #3 refills.

## 2016-08-29 ENCOUNTER — Other Ambulatory Visit: Payer: Self-pay | Admitting: Family Medicine

## 2016-10-09 ENCOUNTER — Other Ambulatory Visit: Payer: Self-pay | Admitting: Family Medicine

## 2016-10-10 NOTE — Telephone Encounter (Signed)
Okay give 3 refills 

## 2016-10-10 NOTE — Telephone Encounter (Signed)
Ok to refill??  Last office visit 07/05/2016.  Last refill 08/17/2016, #1 refill.

## 2016-10-10 NOTE — Telephone Encounter (Signed)
Medication called to pharmacy. 

## 2016-11-30 ENCOUNTER — Other Ambulatory Visit: Payer: Self-pay | Admitting: *Deleted

## 2016-11-30 MED ORDER — NORTRIPTYLINE HCL 25 MG PO CAPS: 75.0000 mg | ORAL_CAPSULE | Freq: Every day | ORAL | 6 refills | Status: DC

## 2016-12-12 ENCOUNTER — Ambulatory Visit: Payer: Self-pay

## 2016-12-21 ENCOUNTER — Encounter: Payer: Self-pay | Admitting: Family Medicine

## 2017-01-02 ENCOUNTER — Ambulatory Visit: Payer: Self-pay

## 2017-01-03 ENCOUNTER — Ambulatory Visit: Payer: BLUE CROSS/BLUE SHIELD | Admitting: Family Medicine

## 2017-01-13 ENCOUNTER — Telehealth: Payer: Self-pay | Admitting: Family Medicine

## 2017-01-13 NOTE — Telephone Encounter (Signed)
Pt states she thinks she may have a UTI, wants to know if we cant call in something for her or advise her of something to do at home. I let her know we could get her in today at 3:30 with pickard and she said she could not come due to working.

## 2017-01-13 NOTE — Telephone Encounter (Signed)
Patient needs to be seen.   If she cannot come in to our office, advise her to go to UC/ ER for evaluation.

## 2017-01-16 ENCOUNTER — Telehealth: Payer: Self-pay | Admitting: *Deleted

## 2017-01-16 MED ORDER — NORTRIPTYLINE HCL 25 MG PO CAPS: 75.0000 mg | ORAL_CAPSULE | Freq: Every day | ORAL | 0 refills | Status: DC

## 2017-01-16 NOTE — Telephone Encounter (Signed)
Received fax requesting refill on Nortriptyline.   Prescription sent to pharmacy.

## 2017-01-17 ENCOUNTER — Other Ambulatory Visit: Payer: Self-pay | Admitting: *Deleted

## 2017-01-17 ENCOUNTER — Encounter: Payer: Self-pay | Admitting: Family Medicine

## 2017-01-17 ENCOUNTER — Ambulatory Visit (INDEPENDENT_AMBULATORY_CARE_PROVIDER_SITE_OTHER): Payer: BLUE CROSS/BLUE SHIELD | Admitting: Family Medicine

## 2017-01-17 VITALS — BP 122/68 | HR 89 | Temp 98.1°F | Resp 14 | Ht 74.0 in | Wt 178.0 lb

## 2017-01-17 DIAGNOSIS — F5101 Primary insomnia: Secondary | ICD-10-CM

## 2017-01-17 DIAGNOSIS — F419 Anxiety disorder, unspecified: Secondary | ICD-10-CM | POA: Diagnosis not present

## 2017-01-17 DIAGNOSIS — N390 Urinary tract infection, site not specified: Secondary | ICD-10-CM | POA: Diagnosis not present

## 2017-01-17 DIAGNOSIS — F330 Major depressive disorder, recurrent, mild: Secondary | ICD-10-CM | POA: Diagnosis not present

## 2017-01-17 LAB — URINALYSIS, MICROSCOPIC ONLY
Casts: NONE SEEN [LPF]
Crystals: NONE SEEN [HPF]
RBC / HPF: NONE SEEN RBC/HPF (ref <=2)
YEAST: NONE SEEN [HPF]

## 2017-01-17 LAB — URINALYSIS, ROUTINE W REFLEX MICROSCOPIC
Bilirubin Urine: NEGATIVE
GLUCOSE, UA: NEGATIVE
HGB URINE DIPSTICK: NEGATIVE
Ketones, ur: NEGATIVE
Nitrite: POSITIVE — AB
PROTEIN: NEGATIVE
Specific Gravity, Urine: 1.025 (ref 1.001–1.035)
pH: 6 (ref 5.0–8.0)

## 2017-01-17 MED ORDER — NORTRIPTYLINE HCL 25 MG PO CAPS: 75.0000 mg | ORAL_CAPSULE | Freq: Every day | ORAL | 0 refills | Status: DC

## 2017-01-17 MED ORDER — NORTRIPTYLINE HCL 25 MG PO CAPS
75.0000 mg | ORAL_CAPSULE | Freq: Every day | ORAL | 0 refills | Status: DC
Start: 2017-01-17 — End: 2017-01-17

## 2017-01-17 MED ORDER — VENLAFAXINE HCL 100 MG PO TABS: ORAL_TABLET | ORAL | 6 refills | Status: DC

## 2017-01-17 MED ORDER — ALPRAZOLAM 1 MG PO TABS: 1.0000 mg | ORAL_TABLET | Freq: Two times a day (BID) | ORAL | 3 refills | Status: DC | PRN

## 2017-01-17 MED ORDER — NITROFURANTOIN MONOHYD MACRO 100 MG PO CAPS: 100.0000 mg | ORAL_CAPSULE | Freq: Two times a day (BID) | ORAL | 0 refills | Status: DC

## 2017-01-17 MED ORDER — ALPRAZOLAM 1 MG PO TABS: ORAL_TABLET | ORAL | 3 refills | Status: DC

## 2017-01-17 MED ORDER — FLUCONAZOLE 150 MG PO TABS: 150.0000 mg | ORAL_TABLET | Freq: Once | ORAL | 0 refills | Status: AC

## 2017-01-17 NOTE — Assessment & Plan Note (Signed)
Her depression and anxiety are still an issue as well as sleep , centered around marital stress and her husband's ability to work and the fact that he is also not helping with the day-to-day at home. She is also still having difficulty with her son and just does not feel supported at home. It's very difficult situation we discussed counseling between her and her husband uniformly O with church or family member she does not think that he will agree to do so. In the meantime I will increase her past labs that she can take it 3 times a day as needed. I discussed the Pamelor caused side effects and she is already on a good dose of venlafaxine

## 2017-01-17 NOTE — Progress Notes (Signed)
Subjective:    Patient ID: Traci Pratt, female    DOB: Apr 21, 1966, 51 y.o.   MRN: [id number]  Patient presents for Follow-up (is not fasting) and Dysuria (urinary frequency and urgency, slight burning)  Pt here to f/u chronic medical problems. Medications reviewed   She is on current treatment for Major Depression and Generalized Anxiety, chronic insomnia.   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 She admits that she drinks very little water typically drinks soda all day.  She tends to be quite stressed out her husband is not helping at home with cooking dinner or clean the home he is makes her to do even that she is feeling one working as he is awaiting his disability. She seems very overwhelmed and does not have any support. She states she's tried talking to him about helping in the home and he still does not do anything to change. She does not feel like she has an intimate partner at home as she has been his caregiver for the past few years. She is still not sleeping well this evening to take Benadryl for her allergies and did not sleep well. She does typically take 2 mg of alprazolam at bedtime  Review Of Systems:  GEN- denies fatigue, fever, weight loss,weakness, recent illness HEENT- denies eye drainage, change in vision, nasal discharge, CVS- denies chest pain, palpitations RESP- denies SOB, cough, wheeze ABD- denies N/V, change in stools, abd pain GU- + dysuria, denies  hematuria, dribbling, incontinence MSK- denies joint pain, muscle aches, injury Neuro- denies headache, dizziness, syncope, seizure activity       Objective:    BP 122/68   Pulse 89   Temp 98.1 F (36.7 C) (Oral)   Resp 14   Ht 6\' 2"   (1.88 m)   Wt 178 lb (80.7 kg)   SpO2 98%   BMI 22.85 kg/m  GEN- NAD, alert and oriented x3 CVS- RRR, no murmur RESP-CTAB ABD-NABS,soft,NT,ND, no CVA tenderness pSYCH- normal affect and mood, no SI, no halluciantions, normal thought process Ext- no edema Pulse 2+        Assessment & Plan:      Problem List Items Addressed This Visit    Insomnia - Primary   Depression   Relevant Medications   venlafaxine (EFFEXOR) 100 MG tablet   Anxiety    Her depression and anxiety are still an issue as well as sleep , centered around marital stress and her husband's ability to work and the fact that he is also not helping with the day-to-day at home. She is also still having difficulty with her son and just does not feel supported at home. It's very difficult situation we discussed counseling between her and her husband uniformly O with church or family member she does not think that he will agree to do so. In the meantime I will increase her past labs that she can take it 3 times a day as needed. I discussed the Pamelor caused side effects and she is already on a good dose of venlafaxine      Relevant Medications   venlafaxine (EFFEXOR) 100 MG tablet    Other Visit Diagnoses  Urinary tract infection without hematuria, site unspecified       Treat with macrobid Diflucan if yeast infection occurs after antibiotics   Relevant Medications   nitrofurantoin, macrocrystal-monohydrate, (MACROBID) 100 MG capsule   fluconazole (DIFLUCAN) 150 MG tablet   Other Relevant Orders   Urinalysis, Routine w reflex microscopic (Completed)      Note: This dictation was prepared with Dragon dictation along with smaller phrase technology. Any transcriptional errors that result from this process are unintentional.

## 2017-01-17 NOTE — Patient Instructions (Addendum)
F/U 6 months for Physical  Take antibiotics Diflucan as needed

## 2017-02-04 ENCOUNTER — Other Ambulatory Visit: Payer: Self-pay | Admitting: Family Medicine

## 2017-04-03 ENCOUNTER — Encounter: Payer: Self-pay | Admitting: Family Medicine

## 2017-04-29 ENCOUNTER — Other Ambulatory Visit: Payer: Self-pay | Admitting: Family Medicine

## 2017-05-30 ENCOUNTER — Other Ambulatory Visit: Payer: Self-pay | Admitting: Family Medicine

## 2017-05-30 NOTE — Telephone Encounter (Signed)
Taper off xanax due to balance, dismissal- she can talk to office manageer about bill if needed to avoid this   Decrease to  BID for 2 weeks, then 0.5mg  BID for 2 weeks, then 0.5mg  at bedtime for 1 week, then stop

## 2017-05-30 NOTE — Telephone Encounter (Signed)
Ok to refill??  Last office visit/ refill 01/17/2017, #3 refills.   Of note, patient with a past due balance at [facility]. Do not schedule until payment or arrangements are made.

## 2017-06-02 NOTE — Telephone Encounter (Signed)
Call placed to patient to make aware.   Requested to pay balance in full.  MD approved refill for when balance is pain in full.

## 2017-06-22 ENCOUNTER — Other Ambulatory Visit: Payer: Self-pay | Admitting: Family Medicine

## 2017-06-22 MED ORDER — NORTRIPTYLINE HCL 25 MG PO CAPS: 75.0000 mg | ORAL_CAPSULE | Freq: Every day | ORAL | 0 refills | Status: DC

## 2017-07-21 ENCOUNTER — Encounter: Payer: BLUE CROSS/BLUE SHIELD | Admitting: Family Medicine

## 2017-07-29 ENCOUNTER — Other Ambulatory Visit: Payer: Self-pay | Admitting: Family Medicine

## 2017-07-31 NOTE — Telephone Encounter (Signed)
Okay to give 1 refill, needs OV 

## 2017-07-31 NOTE — Telephone Encounter (Signed)
Ok to refill??  Last office visit 01/17/2017.  Last refill 06/02/2017, #1 refill.   Of note, patient NS for last appt/ CPE

## 2017-07-31 NOTE — Telephone Encounter (Signed)
Medication called to pharmacy. 

## 2017-08-25 ENCOUNTER — Other Ambulatory Visit: Payer: Self-pay | Admitting: Family Medicine

## 2017-08-25 NOTE — Telephone Encounter (Signed)
Medication called to pharmacy. 

## 2017-08-25 NOTE — Telephone Encounter (Signed)
Ok to refill??  Last office visit 01/17/2017.  Last refill 07/31/2017.

## 2017-08-25 NOTE — Telephone Encounter (Signed)
Okay to refill? 

## 2017-09-24 ENCOUNTER — Other Ambulatory Visit: Payer: Self-pay | Admitting: Family Medicine

## 2017-09-25 ENCOUNTER — Other Ambulatory Visit: Payer: Self-pay | Admitting: Family Medicine

## 2017-09-25 NOTE — Telephone Encounter (Signed)
Ok to refill??  Last office visit 01/17/2017.  Last refill 08/25/2017.

## 2017-10-19 ENCOUNTER — Emergency Department (HOSPITAL_COMMUNITY)
Admission: EM | Admit: 2017-10-19 | Discharge: 2017-10-19 | Disposition: A | Discharge status: Home / Self Care | Payer: BLUE CROSS/BLUE SHIELD | Attending: Emergency Medicine | Admitting: Emergency Medicine

## 2017-10-19 ENCOUNTER — Emergency Department (HOSPITAL_COMMUNITY): Payer: BLUE CROSS/BLUE SHIELD

## 2017-10-19 ENCOUNTER — Encounter (HOSPITAL_COMMUNITY): Payer: Self-pay

## 2017-10-19 DIAGNOSIS — Y999 Unspecified external cause status: Secondary | ICD-10-CM | POA: Diagnosis not present

## 2017-10-19 DIAGNOSIS — S20212A Contusion of left front wall of thorax, initial encounter: Secondary | ICD-10-CM | POA: Diagnosis not present

## 2017-10-19 DIAGNOSIS — Y929 Unspecified place or not applicable: Secondary | ICD-10-CM | POA: Insufficient documentation

## 2017-10-19 DIAGNOSIS — S20302A Unspecified superficial injuries of left front wall of thorax, initial encounter: Secondary | ICD-10-CM | POA: Diagnosis not present

## 2017-10-19 DIAGNOSIS — Z79899 Other long term (current) drug therapy: Secondary | ICD-10-CM | POA: Diagnosis not present

## 2017-10-19 DIAGNOSIS — R0781 Pleurodynia: Secondary | ICD-10-CM | POA: Diagnosis not present

## 2017-10-19 DIAGNOSIS — Y9389 Activity, other specified: Secondary | ICD-10-CM | POA: Insufficient documentation

## 2017-10-19 DIAGNOSIS — W51XXXA Accidental striking against or bumped into by another person, initial encounter: Secondary | ICD-10-CM | POA: Diagnosis not present

## 2017-10-19 DIAGNOSIS — F329 Major depressive disorder, single episode, unspecified: Secondary | ICD-10-CM | POA: Diagnosis not present

## 2017-10-19 MED ORDER — KETOROLAC TROMETHAMINE 60 MG/2ML IM SOLN
60.0000 mg | Freq: Once | INTRAMUSCULAR | Status: AC
Start: 2017-10-19 — End: 2017-10-19
  Administered 2017-10-19: 60 mg via INTRAMUSCULAR
  Filled 2017-10-19: qty 2

## 2017-10-19 MED ORDER — HYDROCODONE-ACETAMINOPHEN 5-325 MG PO TABS: 1.0000 | ORAL_TABLET | ORAL | 0 refills | Status: DC | PRN

## 2017-10-19 MED ORDER — OXYCODONE-ACETAMINOPHEN 5-325 MG PO TABS
1.0000 | ORAL_TABLET | Freq: Once | ORAL | Status: AC
  Administered 2017-10-19: 1 via ORAL
  Filled 2017-10-19: qty 1
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
## 2017-10-19 NOTE — ED Provider Notes (Signed)
[facility] EMERGENCY DEPARTMENT Provider Note   CSN: [id number] Arrival date & time: 10/19/17  0255     History   Chief Complaint Chief Complaint  Patient presents with  . Chest Pain    left rib pain    HPI Traci Pratt is a 52 y.o. female.  Patient presents to the ER for evaluation of pain.  Patient reports that she choked while eating a week ago and her husband had to administer Heimlich maneuver to her.  She has been having left anterior rib pain since this occurred.  She says that initially Tylenol would ease the pain, but tonight the pain worsened.  She is not having any associated shortness of breath.  She has not had a fever.      Past Medical History:  Diagnosis Date  . Anxiety   . Depression   . HSV-2 (herpes simplex virus 2) infection   . Vitamin D deficiency     Patient Active Problem List   Diagnosis Date Noted  . Vitamin D deficiency 07/05/2016  . Easy bruising 06/08/2015  . Rhinitis 06/08/2015  . Dry eye syndrome 10/15/2014  . Other malaise and fatigue 10/11/2013  . Ganglion cyst of left foot 10/11/2013  . Loss of weight 10/11/2013  . Carpal tunnel syndrome 07/01/2012  . Swelling of hand joint 11/13/2011  . Anxiety 05/06/2011  . Depression 05/06/2011  . Insomnia 05/06/2011  . Hyperlipidemia 05/06/2011    Past Surgical History:  Procedure Laterality Date  . TUBAL LIGATION      OB History    No data available       Home Medications    Prior to Admission medications   Medication Sig Start Date End Date Taking? Authorizing Provider  ALPRAZolam Prudy Feeler) 1 MG tablet TAKE 1 TABLET BY MOUTH THREE TIMES A DAY AS NEEDED 09/25/17   Salley Scarlet, MD  HYDROcodone-acetaminophen (NORCO/VICODIN) 5-325 MG tablet Take 1 tablet by mouth every 4 (four) hours as needed for moderate pain. 10/19/17   Gilda Crease, MD  ibuprofen (ADVIL,MOTRIN) 800 MG tablet TAKE 1 TABLET BY MOUTH EVERY 8 HOURS AS NEEDED FOR PAIN 10/08/13   Salley Scarlet, MD  Multiple Vitamin (MULTIVITAMIN) tablet Take 1 tablet by mouth daily.      [provider]  nitrofurantoin, macrocrystal-monohydrate, (MACROBID) 100 MG capsule Take 1 capsule (100 mg total) by mouth 2 (two) times daily. 01/17/17   Salley Scarlet, MD  nortriptyline (PAMELOR) 25 MG capsule Take 3 capsules (75 mg total) by mouth at bedtime. 06/22/17   Salley Scarlet, MD  venlafaxine (EFFEXOR) 100 MG tablet TAKE 1 TABLET BY MOUTH TWICE A DAY 09/25/17   [place], Velna Hatchet, MD  vitamin B-12 (CYANOCOBALAMIN) 250 MCG tablet Take 250 mcg by mouth daily.      [provider]  vitamin C (ASCORBIC ACID) 500 MG tablet Take 500 mg by mouth daily.    [provider]    Family History Family History  Problem Relation Age of Onset  . Cancer Mother   . Hypertension Mother   . Cancer Father   . Hypertension Father   . Cancer Maternal Grandmother   . Cancer Paternal Grandmother   . Cancer Paternal Grandfather     Social History Social History   Tobacco Use  . Smoking status: Never Smoker  . Smokeless tobacco: Never Used  Substance Use Topics  . Alcohol use: Yes    Alcohol/week: 0.0 oz    Comment: occasionally  .  Drug use: Yes    Types: Marijuana     Allergies   Shellfish allergy; Azithromycin; and Penicillins   Review of Systems Review of Systems  Respiratory: Negative for shortness of breath.   All other systems reviewed and are negative.    Physical Exam Updated Vital Signs BP 129/72 (BP Location: Left Arm)   Pulse 89   Temp 98 F (36.7 C) (Oral)   Resp 20   SpO2 100%   Physical Exam  Constitutional: She is oriented to person, place, and time. She appears well-developed and well-nourished. No distress.  HENT:  Head: Normocephalic and atraumatic.  Right Ear: Hearing normal.  Left Ear: Hearing normal.  Nose: Nose normal.  Mouth/Throat: Oropharynx is clear and moist and mucous membranes are normal.  Eyes: Conjunctivae and EOM are  normal. Pupils are equal, round, and reactive to light.  Neck: Normal range of motion. Neck supple.  Cardiovascular: Regular rhythm, S1 normal and S2 normal. Exam reveals no gallop and no friction rub.  No murmur heard. Pulmonary/Chest: Effort normal and breath sounds normal. No respiratory distress. She exhibits tenderness. She exhibits no crepitus.    Abdominal: Soft. Normal appearance and bowel sounds are normal. There is no hepatosplenomegaly. There is no tenderness. There is no rebound, no guarding, no tenderness at McBurney's point and negative Murphy's sign. No hernia.  Musculoskeletal: Normal range of motion.  Neurological: She is alert and oriented to person, place, and time. She has normal strength. No cranial nerve deficit or sensory deficit. Coordination normal. GCS eye subscore is 4. GCS verbal subscore is 5. GCS motor subscore is 6.  Skin: Skin is warm, dry and intact. No rash noted. No cyanosis.  Psychiatric: She has a normal mood and affect. Her speech is normal and behavior is normal. Thought content normal.  Nursing note and vitals reviewed.    ED Treatments / Results  Labs (all labs ordered are listed, but only abnormal results are displayed) Labs Reviewed - No data to display  EKG  EKG Interpretation None       Radiology Dg Ribs Unilateral W/chest Left  Result Date: 10/19/2017 CLINICAL DATA:  Left-sided rib pain EXAM: LEFT RIBS AND CHEST - 3+ VIEW COMPARISON:  None. FINDINGS: No fracture or other bone lesions are seen involving the ribs. There is no evidence of pneumothorax or pleural effusion. Both lungs are clear. Heart size and mediastinal contours are within normal limits. IMPRESSION: Negative. Electronically Signed   By: Jasmine PangKim  Fujinaga M.D.   On: 10/19/2017 03:55    Procedures Procedures (including critical care time)  Medications Ordered in ED Medications  oxyCODONE-acetaminophen (PERCOCET/ROXICET) 5-325 MG per tablet 1 tablet (1 tablet Oral Given 10/19/17  0332)  ketorolac (TORADOL) injection 60 mg (60 mg Intramuscular Given 10/19/17 0332)     Initial Impression / Assessment and Plan / ED Course  I have reviewed the triage vital signs and the nursing notes.  Pertinent labs & imaging results that were available during my care of the patient were reviewed by me and considered in my medical decision making (see chart for details).     Patient presents to the ER for evaluation of left rib pain.  Patient received the Heimlich maneuver 1 week ago when she was choking on some food.  She has been having left rib pain since.  Pain worsened tonight unexpectedly.  She is breathing comfortably.  Oxygenation is 100%.  She has tenderness without crepitance over the left anterior ribs.  X-ray of lungs is  normal.  Left rib films do not show any obvious fracture.  Patient reassured, treated with analgesia.  Final Clinical Impressions(s) / ED Diagnoses   Final diagnoses:  Rib contusion, left, initial encounter    ED Discharge Orders        Ordered    HYDROcodone-acetaminophen (NORCO/VICODIN) 5-325 MG tablet  Every 4 hours PRN     10/19/17 0404       Gilda Crease, MD 10/19/17 [id number]

## 2017-10-25 ENCOUNTER — Other Ambulatory Visit: Payer: Self-pay | Admitting: Family Medicine

## 2017-10-25 ENCOUNTER — Encounter: Payer: Self-pay | Admitting: Family Medicine

## 2017-10-25 ENCOUNTER — Ambulatory Visit (INDEPENDENT_AMBULATORY_CARE_PROVIDER_SITE_OTHER): Payer: BLUE CROSS/BLUE SHIELD | Admitting: Family Medicine

## 2017-10-25 ENCOUNTER — Other Ambulatory Visit: Payer: Self-pay

## 2017-10-25 VITALS — BP 124/78 | HR 86 | Temp 98.2°F | Resp 16 | Ht 74.0 in | Wt 173.0 lb

## 2017-10-25 DIAGNOSIS — F419 Anxiety disorder, unspecified: Secondary | ICD-10-CM | POA: Diagnosis not present

## 2017-10-25 DIAGNOSIS — Z1211 Encounter for screening for malignant neoplasm of colon: Secondary | ICD-10-CM | POA: Diagnosis not present

## 2017-10-25 DIAGNOSIS — E7800 Pure hypercholesterolemia, unspecified: Secondary | ICD-10-CM

## 2017-10-25 DIAGNOSIS — E559 Vitamin D deficiency, unspecified: Secondary | ICD-10-CM | POA: Diagnosis not present

## 2017-10-25 DIAGNOSIS — F5101 Primary insomnia: Secondary | ICD-10-CM

## 2017-10-25 DIAGNOSIS — Z0000 Encounter for general adult medical examination without abnormal findings: Secondary | ICD-10-CM | POA: Diagnosis not present

## 2017-10-25 DIAGNOSIS — R94120 Abnormal auditory function study: Secondary | ICD-10-CM | POA: Diagnosis not present

## 2017-10-25 DIAGNOSIS — J309 Allergic rhinitis, unspecified: Secondary | ICD-10-CM | POA: Diagnosis not present

## 2017-10-25 DIAGNOSIS — Z1231 Encounter for screening mammogram for malignant neoplasm of breast: Secondary | ICD-10-CM

## 2017-10-25 DIAGNOSIS — F330 Major depressive disorder, recurrent, mild: Secondary | ICD-10-CM

## 2017-10-25 DIAGNOSIS — Z1239 Encounter for other screening for malignant neoplasm of breast: Secondary | ICD-10-CM

## 2017-10-25 MED ORDER — TRIAMCINOLONE ACETONIDE 55 MCG/ACT NA AERO: 2.0000 | INHALATION_SPRAY | Freq: Every day | NASAL | 12 refills | Status: DC

## 2017-10-25 MED ORDER — CETIRIZINE HCL 10 MG PO TABS: 10.0000 mg | ORAL_TABLET | Freq: Every day | ORAL | 11 refills | Status: DC

## 2017-10-25 MED ORDER — HYDROCODONE-ACETAMINOPHEN 5-325 MG PO TABS: 1.0000 | ORAL_TABLET | Freq: Four times a day (QID) | ORAL | 0 refills | Status: DC | PRN

## 2017-10-25 NOTE — Assessment & Plan Note (Signed)
Recheck vitamin D levels 

## 2017-10-25 NOTE — Patient Instructions (Addendum)
I recommend eye visit once a year I recommend dental visit every 6 months Goal is to  Exercise 30 minutes 5 days a week We will call with lab results  Schedule your mammogram Cologuard screening   Referral to ENT  F/U 6 months

## 2017-10-25 NOTE — Telephone Encounter (Signed)
Ok to refill??  Last office visit 10/25/2017.  Last refill 09/25/2017.

## 2017-10-25 NOTE — Assessment & Plan Note (Signed)
Check lipid panel  
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
## 2017-10-25 NOTE — Assessment & Plan Note (Signed)
Depression anxiety have been long-standing and difficult to control also in the setting of her insomnia.  I believe she has had some improvements financially wise in on her job.  She does not want to do psychotherapy states it is never helped in the past.  So much of her depression fatigue irritability extends around the situation with her son which there seems to be no light in the future to fix.  She is continued on her current dose of medications which she does find helpful.

## 2017-10-26 ENCOUNTER — Telehealth: Payer: Self-pay | Admitting: *Deleted

## 2017-10-26 LAB — COMPREHENSIVE METABOLIC PANEL
AG RATIO: 1.6 (calc) (ref 1.0–2.5)
ALKALINE PHOSPHATASE (APISO): 78 U/L (ref 33–130)
ALT: 38 U/L — ABNORMAL HIGH (ref 6–29)
AST: 42 U/L — AB (ref 10–35)
Albumin: 4.4 g/dL (ref 3.6–5.1)
BILIRUBIN TOTAL: 0.4 mg/dL (ref 0.2–1.2)
BUN: 9 mg/dL (ref 7–25)
CALCIUM: 9.8 mg/dL (ref 8.6–10.4)
CHLORIDE: 104 mmol/L (ref 98–110)
CO2: 29 mmol/L (ref 20–32)
CREATININE: 0.79 mg/dL (ref 0.50–1.05)
GLOBULIN: 2.7 g/dL (ref 1.9–3.7)
Glucose, Bld: 79 mg/dL (ref 65–99)
Potassium: 4.2 mmol/L (ref 3.5–5.3)
Sodium: 141 mmol/L (ref 135–146)
Total Protein: 7.1 g/dL (ref 6.1–8.1)

## 2017-10-26 LAB — CBC WITH DIFFERENTIAL/PLATELET
BASOS PCT: 0.4 %
Basophils Absolute: 21 cells/uL (ref 0–200)
EOS ABS: 203 {cells}/uL (ref 15–500)
Eosinophils Relative: 3.9 %
HCT: 38.4 % (ref 35.0–45.0)
Hemoglobin: 13.3 g/dL (ref 11.7–15.5)
Lymphs Abs: 2673 cells/uL (ref 850–3900)
MCH: 29.8 pg (ref 27.0–33.0)
MCHC: 34.6 g/dL (ref 32.0–36.0)
MCV: 86.1 fL (ref 80.0–100.0)
MPV: 11.2 fL (ref 7.5–12.5)
Monocytes Relative: 6.6 %
Neutro Abs: 1960 cells/uL (ref 1500–7800)
Neutrophils Relative %: 37.7 %
PLATELETS: 308 10*3/uL (ref 140–400)
RBC: 4.46 10*6/uL (ref 3.80–5.10)
RDW: 12.6 % (ref 11.0–15.0)
TOTAL LYMPHOCYTE: 51.4 %
WBC: 5.2 10*3/uL (ref 3.8–10.8)
WBCMIX: 343 {cells}/uL (ref 200–950)

## 2017-10-26 LAB — LIPID PANEL
CHOLESTEROL: 201 mg/dL — AB (ref <200)
HDL: 64 mg/dL (ref >50)
LDL Cholesterol (Calc): 120 mg/dL (calc) — ABNORMAL HIGH
Non-HDL Cholesterol (Calc): 137 mg/dL (calc) — ABNORMAL HIGH (ref <130)
Total CHOL/HDL Ratio: 3.1 (calc) (ref <5.0)
Triglycerides: 76 mg/dL (ref <150)

## 2017-10-26 LAB — VITAMIN D 25 HYDROXY (VIT D DEFICIENCY, FRACTURES): Vit D, 25-Hydroxy: 64 ng/mL (ref 30–100)

## 2017-10-26 NOTE — Telephone Encounter (Signed)
Received verbal orders for Cologuard.   Order placed via [facility].   Order [id number] has been submitted.

## 2017-10-30 ENCOUNTER — Encounter: Payer: Self-pay | Admitting: Family Medicine

## 2017-11-06 ENCOUNTER — Other Ambulatory Visit: Payer: Self-pay | Admitting: *Deleted

## 2017-11-06 DIAGNOSIS — R7989 Other specified abnormal findings of blood chemistry: Secondary | ICD-10-CM

## 2017-11-06 DIAGNOSIS — R945 Abnormal results of liver function studies: Principal | ICD-10-CM

## 2017-11-21 ENCOUNTER — Ambulatory Visit
Admission: RE | Admit: 2017-11-21 | Discharge: 2017-11-21 | Disposition: A | Discharge status: Home / Self Care | Payer: BLUE CROSS/BLUE SHIELD | Source: Ambulatory Visit | Attending: Family Medicine | Admitting: Family Medicine

## 2017-11-21 ENCOUNTER — Ambulatory Visit: Payer: BLUE CROSS/BLUE SHIELD

## 2017-11-21 DIAGNOSIS — Z1231 Encounter for screening mammogram for malignant neoplasm of breast: Secondary | ICD-10-CM | POA: Diagnosis not present

## 2017-11-21 DIAGNOSIS — Z1239 Encounter for other screening for malignant neoplasm of breast: Secondary | ICD-10-CM

## 2017-11-23 ENCOUNTER — Ambulatory Visit (INDEPENDENT_AMBULATORY_CARE_PROVIDER_SITE_OTHER): Payer: BLUE CROSS/BLUE SHIELD | Admitting: Otolaryngology

## 2017-12-11 ENCOUNTER — Ambulatory Visit: Payer: BLUE CROSS/BLUE SHIELD

## 2017-12-11 DIAGNOSIS — R945 Abnormal results of liver function studies: Secondary | ICD-10-CM | POA: Diagnosis not present

## 2017-12-11 DIAGNOSIS — R7989 Other specified abnormal findings of blood chemistry: Secondary | ICD-10-CM

## 2017-12-11 LAB — COMPLETE METABOLIC PANEL WITH GFR
AG Ratio: 1.6 (calc) (ref 1.0–2.5)
ALBUMIN MSPROF: 4 g/dL (ref 3.6–5.1)
ALKALINE PHOSPHATASE (APISO): 89 U/L (ref 33–130)
ALT: 18 U/L (ref 6–29)
AST: 18 U/L (ref 10–35)
BUN: 10 mg/dL (ref 7–25)
CHLORIDE: 104 mmol/L (ref 98–110)
CO2: 31 mmol/L (ref 20–32)
CREATININE: 0.75 mg/dL (ref 0.50–1.05)
Calcium: 9.4 mg/dL (ref 8.6–10.4)
GFR, EST AFRICAN AMERICAN: 107 mL/min/{1.73_m2} (ref >=60)
GFR, Est Non African American: 92 mL/min/{1.73_m2} (ref >=60)
GLUCOSE: 89 mg/dL (ref 65–99)
Globulin: 2.5 g/dL (calc) (ref 1.9–3.7)
Potassium: 4.7 mmol/L (ref 3.5–5.3)
Sodium: 140 mmol/L (ref 135–146)
TOTAL PROTEIN: 6.5 g/dL (ref 6.1–8.1)
Total Bilirubin: 0.5 mg/dL (ref 0.2–1.2)

## 2017-12-11 LAB — EXTRA LAV TOP TUBE

## 2017-12-14 ENCOUNTER — Encounter: Payer: Self-pay | Admitting: *Deleted

## 2017-12-21 ENCOUNTER — Other Ambulatory Visit: Payer: Self-pay | Admitting: *Deleted

## 2017-12-21 MED ORDER — NORTRIPTYLINE HCL 25 MG PO CAPS: 75.0000 mg | ORAL_CAPSULE | Freq: Every day | ORAL | 0 refills | Status: DC

## 2018-01-16 ENCOUNTER — Other Ambulatory Visit: Payer: Self-pay | Admitting: Family Medicine

## 2018-01-16 NOTE — Telephone Encounter (Signed)
Ok to refill??  Last office visit 10/25/2017.  Last refill 10/25/2017, #2 refills.

## 2018-03-24 ENCOUNTER — Other Ambulatory Visit: Payer: Self-pay | Admitting: Family Medicine

## 2018-04-10 ENCOUNTER — Other Ambulatory Visit: Payer: Self-pay | Admitting: Family Medicine

## 2018-04-10 NOTE — Telephone Encounter (Signed)
Ok to refill??  Last office visit 10/25/2017.  Last refill 01/16/2018, #2 refills.

## 2018-04-25 ENCOUNTER — Ambulatory Visit: Payer: BLUE CROSS/BLUE SHIELD | Admitting: Family Medicine

## 2018-05-04 ENCOUNTER — Ambulatory Visit: Payer: BLUE CROSS/BLUE SHIELD | Admitting: Family Medicine

## 2018-05-04 VITALS — BP 120/80 | HR 89 | Temp 97.6°F | Resp 16 | Ht 74.0 in | Wt 181.5 lb

## 2018-05-04 DIAGNOSIS — F331 Major depressive disorder, recurrent, moderate: Secondary | ICD-10-CM

## 2018-05-04 DIAGNOSIS — E7800 Pure hypercholesterolemia, unspecified: Secondary | ICD-10-CM | POA: Diagnosis not present

## 2018-05-04 DIAGNOSIS — F5101 Primary insomnia: Secondary | ICD-10-CM | POA: Diagnosis not present

## 2018-05-04 DIAGNOSIS — F419 Anxiety disorder, unspecified: Secondary | ICD-10-CM | POA: Diagnosis not present

## 2018-05-04 MED ORDER — ARIPIPRAZOLE 5 MG PO TABS: 5.0000 mg | ORAL_TABLET | Freq: Every day | ORAL | 3 refills | Status: DC

## 2018-05-04 NOTE — Progress Notes (Signed)
   Subjective:    Patient ID: Traci Pratt, female    DOB: 12-07-1965, 52 y.o.   MRN: [id number]  Patient presents for Hyperlipidemia and Depression     Pt here to f/u chronic medical problems. Very stressed out and depressed. See previous notes, has not spoken to son or seen grandchild in > 1 year. Her sister doesn't speak to her now. Husband on disability and not giving her the love and support she feels she needs and is primary breadwinner. Still doesn't sleep well despite the pamelor  xanax, and effexor 100mg  BID, asking for something more to help her nerves She is always fatigued, gaining weight,feels she cant focus, will sometimes get headache or nervous stomach  No SI states she has too much faith, but in general very stressed out She has called out of work a few times    Not due for repeat lipids/labs until physical, reviewed last set with her   Review Of Systems:  GEN- denies fatigue, fever, weight loss,weakness, recent illness HEENT- denies eye drainage, change in vision, nasal discharge, CVS- denies chest pain, palpitations RESP- denies SOB, cough, wheeze ABD- denies N/V, change in stools, abd pain GU- denies dysuria, hematuria, dribbling, incontinence MSK- denies joint pain, muscle aches, injury Neuro- denies headache, dizziness, syncope, seizure activity       Objective:    BP 120/80   Pulse 89   Temp 97.6 F (36.4 C) (Oral)   Resp 16   Ht 6\' 2"  (1.88 m)   Wt 181 lb 8 oz (82.3 kg)   SpO2 98%   BMI 23.30 kg/m  GEN- NAD, alert and oriented x3 HEENT- PERRL, EOMI, non injected sclera, pink conjunctiva, MMM, oropharynx clear Neck- Supple, no thyromegaly CVS- RRR, no murmur RESP-CTAB ABD-NABS,soft,NT,ND Psych- depressed affect, not anxious, no SI, well groomed, good eye contact         Assessment & Plan:     approx 30 minutes spent with pt > 50% on counseling /med management  Problem List Items Addressed This Visit      Unprioritized   Anxiety    Depression - Primary    Significant depression and anxiety for many years Has declined inpast going back to therapist  Tried on multiple meds for sleep, depression Has seen psychiatrist in past for med management, I think she would benefit returning to psychiatry and psychology she needs therapy tohelp her with the boundries placed by her family members Her somatic complaints are all associated with her depression Discussed wellbutrin vs abilify, will add abilify 5mg  to morning anti-depressant and see how she does in a few weeks No change to xanax, already on TID or Pamelor, nothing else works for her sleep  No   Unable to afford going on medical leave either       Hyperlipidemia    Watch diet, no meds       Insomnia      Note: This dictation was prepared with Dragon dictation along with smaller [employer]phrase technology. Any transcriptional errors that result from this process are unintentional.

## 2018-05-04 NOTE — Patient Instructions (Addendum)
Start abilify in the morning Continue all the other medications Psychiatry referral  F/U4-5 Weeks with Traci HaleMary Pratt F/U Feb for Physical

## 2018-05-06 ENCOUNTER — Other Ambulatory Visit: Payer: Self-pay | Admitting: Family Medicine

## 2018-05-06 ENCOUNTER — Encounter: Payer: Self-pay | Admitting: Family Medicine

## 2018-05-06 NOTE — Assessment & Plan Note (Addendum)
Significant depression and anxiety for many years Has declined inpast going back to therapist  Tried on multiple meds for sleep, depression Has seen psychiatrist in past for med management, I think she would benefit returning to psychiatry and psychology she needs therapy tohelp her with the boundries placed by her family members Her somatic complaints are all associated with her depression Discussed wellbutrin vs abilify, will add abilify 5mg  to morning anti-depressant and see how she does in a few weeks No change to xanax, already on TID or Pamelor, nothing else works for her sleep  No   Unable to afford going on medical leave either

## 2018-05-06 NOTE — Assessment & Plan Note (Signed)
Watch diet, no meds

## 2018-05-28 ENCOUNTER — Emergency Department (HOSPITAL_COMMUNITY)
Admission: EM | Admit: 2018-05-28 | Discharge: 2018-05-28 | Disposition: A | Discharge status: Home / Self Care | Payer: BLUE CROSS/BLUE SHIELD | Attending: Emergency Medicine | Admitting: Emergency Medicine

## 2018-05-28 ENCOUNTER — Other Ambulatory Visit: Payer: Self-pay

## 2018-05-28 ENCOUNTER — Encounter (HOSPITAL_COMMUNITY): Payer: Self-pay

## 2018-05-28 ENCOUNTER — Emergency Department (HOSPITAL_COMMUNITY): Payer: BLUE CROSS/BLUE SHIELD

## 2018-05-28 DIAGNOSIS — M25512 Pain in left shoulder: Secondary | ICD-10-CM

## 2018-05-28 DIAGNOSIS — Z79899 Other long term (current) drug therapy: Secondary | ICD-10-CM | POA: Insufficient documentation

## 2018-05-28 DIAGNOSIS — S4992XA Unspecified injury of left shoulder and upper arm, initial encounter: Secondary | ICD-10-CM | POA: Diagnosis not present

## 2018-05-28 MED ORDER — IBUPROFEN 800 MG PO TABS
800.0000 mg | ORAL_TABLET | Freq: Once | ORAL | Status: AC
  Administered 2018-05-28: 800 mg via ORAL
  Filled 2018-05-28: qty 1

## 2018-05-28 MED ORDER — IBUPROFEN 800 MG PO TABS: 800.0000 mg | ORAL_TABLET | Freq: Three times a day (TID) | ORAL | 0 refills | Status: DC

## 2018-05-28 MED ORDER — CYCLOBENZAPRINE HCL 10 MG PO TABS: 10.0000 mg | ORAL_TABLET | Freq: Three times a day (TID) | ORAL | 0 refills | Status: DC | PRN

## 2018-05-28 MED ORDER — CYCLOBENZAPRINE HCL 10 MG PO TABS
10.0000 mg | ORAL_TABLET | Freq: Once | ORAL | Status: AC
  Administered 2018-05-28: 10 mg via ORAL
  Filled 2018-05-28: qty 1
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
## 2018-05-28 NOTE — Discharge Instructions (Addendum)
Apply ice packs on and off to your shoulder.  Take the medication as directed, do not take the Flexeril while driving or operating machinery.  Call the orthopedic provider listed in 1 week to arrange a follow-up appointment if not improving.
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
## 2018-05-31 ENCOUNTER — Encounter: Payer: Self-pay | Admitting: Orthopaedic Surgery

## 2018-05-31 ENCOUNTER — Ambulatory Visit (INDEPENDENT_AMBULATORY_CARE_PROVIDER_SITE_OTHER): Payer: BLUE CROSS/BLUE SHIELD | Admitting: Orthopaedic Surgery

## 2018-05-31 VITALS — BP 146/86 | HR 101 | Ht 74.0 in | Wt 181.0 lb

## 2018-05-31 DIAGNOSIS — M25512 Pain in left shoulder: Secondary | ICD-10-CM | POA: Diagnosis not present

## 2018-05-31 MED ORDER — HYDROCODONE-ACETAMINOPHEN 7.5-325 MG PO TABS: ORAL_TABLET | ORAL | 0 refills | Status: DC
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
## 2018-05-31 NOTE — Patient Instructions (Addendum)
OUT OF WORK How to Use a Sling A sling is a type of hanging bandage. You wear it around your neck to protect an injured arm, shoulder, or other body part. You may need to wear a sling to keep you from moving the injured body part while it heals. Keeping the injured part of your body still reduces pain and speeds up healing. Your doctor may suggest you use a sling if you have:  A broken arm.  A broken collarbone.  A shoulder injury.  Surgery.  What are the risks? Wearing a sling the wrong way can:  Make your injury worse.  Cause stiffness or numbness.  Affect blood circulation in your arm and hand. This can cause tingling or numbness in your fingers or hands.  How to use a sling The way that you should use a sling depends on your injury. It is important that you follow all of your doctor's instructions for your injury. Also follow these general suggestions:  Wear the sling so that your arm bends 90 degrees at the elbow. That is like a right angle or the shape of a capital letter "L." The sling should also support your wrist and hand.  Try not to move your arm.  Do not lie down flat on your back while you have to wear a sling. Sleep in a recliner or use pillows to raise your upper body in bed.  Do not twist, raise, or move your arm in a way that could make your injury worse.  Do not lean on your arm while you have to wear a sling.  Do not lift anything while you have to wear a sling.  Get help if:  You have bruising, swelling, or pain that is getting worse.  Your pain medicine is not helping.  You have a fever. Get help right away if:  Your fingers are numb or tingling.  Your fingers turn blue or feel cold to the touch.  You cannot control the bleeding from your injury.  You are short of breath. This information is not intended to replace advice given to you by your health care provider. Make sure you discuss any questions you have with your health care  provider. Document Released: 11/23/2009 Document Revised: 02/04/2016 Document Reviewed: 07/02/2014 Elsevier Interactive Patient Education  [employer]2018 Elsevier Inc.

## 2018-06-04 ENCOUNTER — Telehealth: Payer: Self-pay | Admitting: Orthopaedic Surgery

## 2018-06-04 NOTE — Telephone Encounter (Signed)
Patient called about forms for short-term disability forms for her job, [employer]. Discussed forms process; aware.

## 2018-06-07 ENCOUNTER — Ambulatory Visit: Payer: BLUE CROSS/BLUE SHIELD | Admitting: Orthopaedic Surgery

## 2018-06-07 ENCOUNTER — Encounter: Payer: Self-pay | Admitting: Orthopaedic Surgery

## 2018-06-07 ENCOUNTER — Encounter: Payer: Self-pay | Admitting: Physician Assistant

## 2018-06-07 ENCOUNTER — Ambulatory Visit: Payer: BLUE CROSS/BLUE SHIELD | Admitting: Physician Assistant

## 2018-06-07 VITALS — BP 112/80 | HR 111 | Ht 74.0 in | Wt 181.0 lb

## 2018-06-07 VITALS — BP 124/68 | HR 98 | Temp 97.5°F | Resp 18 | Ht 70.5 in | Wt 182.6 lb

## 2018-06-07 DIAGNOSIS — Z23 Encounter for immunization: Secondary | ICD-10-CM | POA: Diagnosis not present

## 2018-06-07 DIAGNOSIS — F419 Anxiety disorder, unspecified: Secondary | ICD-10-CM

## 2018-06-07 DIAGNOSIS — F329 Major depressive disorder, single episode, unspecified: Secondary | ICD-10-CM | POA: Diagnosis not present

## 2018-06-07 DIAGNOSIS — M25512 Pain in left shoulder: Secondary | ICD-10-CM

## 2018-06-07 DIAGNOSIS — F32A Depression, unspecified: Secondary | ICD-10-CM

## 2018-06-07 MED ORDER — HYDROCODONE-ACETAMINOPHEN 7.5-325 MG PO TABS: ORAL_TABLET | ORAL | 0 refills | Status: DC
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
## 2018-06-07 NOTE — Progress Notes (Signed)
[name] Traci Pratt, female DOB:05-15-1966, 52 y.o. VWU:[id number]  Chief Complaint  Patient presents with  . Shoulder Injury    Left shoulder.    HPI  Traci Pratt is a 52 y.o. female who has left shoulder pain.  She has less pain.  She has been using the sling and swathe.  She has no numbness.  She is taking her medicine.  She has no new trauma.  I will begin PT for her in [place].  She is to stay out of work.  She may come out of sling.   Body mass index is 23.24 kg/m.  ROS  Review of Systems  Constitutional: Positive for activity change.  Musculoskeletal: Positive for arthralgias and joint swelling.  Psychiatric/Behavioral: The patient is nervous/anxious.   All other systems reviewed and are negative.   All other systems reviewed and are negative.  The following is a summary of the past history medically, past history surgically, known current medicines, social history and family history.  This information is gathered electronically by the computer from prior information and documentation.  I review this each visit and have found including this information at this point in the chart is beneficial and informative.    Past Medical History:  Diagnosis Date  . Anxiety   . Depression   . HSV-2 (herpes simplex virus 2) infection   . Vitamin D deficiency     Past Surgical History:  Procedure Laterality Date  . BREAST BIOPSY Left   . TUBAL LIGATION      Family History  Problem Relation Age of Onset  . Cancer Mother   . Hypertension Mother   . Breast cancer Mother   . Cancer Father   . Hypertension Father   . Cancer Maternal Grandmother   . Breast cancer Maternal Grandmother   . Cancer Paternal Grandmother   . Breast cancer Paternal Grandmother   . Cancer Paternal Grandfather     Social History Social History   Tobacco Use  . Smoking status: Never Smoker  . Smokeless tobacco: Never Used  Substance Use Topics  . Alcohol use:  Yes    Alcohol/week: 0.0 standard drinks    Comment: occasionally  . Drug use: Yes    Types: Marijuana    Allergies  Allergen Reactions  . Shellfish Allergy Anaphylaxis  . Azithromycin   . Penicillins     Current Outpatient Medications  Medication Sig Dispense Refill  . ALPRAZolam (XANAX) 1 MG tablet TAKE 1 TABLET BY MOUTH THREE TIMES A DAY AS NEEDED 90 tablet 2  . ARIPiprazole (ABILIFY) 5 MG tablet Take 1 tablet (5 mg total) by mouth daily. 30 tablet 3  . cetirizine (ZYRTEC) 10 MG tablet Take 1 tablet (10 mg total) by mouth daily. 30 tablet 11  . cyclobenzaprine (FLEXERIL) 10 MG tablet Take 1 tablet (10 mg total) by mouth 3 (three) times daily as needed. 21 tablet 0  . HYDROcodone-acetaminophen (NORCO) 7.5-325 MG tablet One tablet every four hours as needed for pain. 30 tablet 0  . ibuprofen (ADVIL,MOTRIN) 800 MG tablet Take 1 tablet (800 mg total) by mouth 3 (three) times daily. Take with food 21 tablet 0  . Multiple Vitamin (MULTIVITAMIN) tablet Take 1 tablet by mouth daily.      . nortriptyline (PAMELOR) 25 MG capsule TAKE 3 CAPSULES (75 MG TOTAL) BY MOUTH AT BEDTIME. 270 capsule 0  . triamcinolone (NASACORT) 55 MCG/ACT AERO nasal inhaler Place 2 sprays into the nose daily. 1 Inhaler 12  .  venlafaxine (EFFEXOR) 100 MG tablet TAKE 1 TABLET BY MOUTH TWICE A DAY 60 tablet 6  . vitamin B-12 (CYANOCOBALAMIN) 250 MCG tablet Take 250 mcg by mouth daily.      . vitamin C (ASCORBIC ACID) 500 MG tablet Take 500 mg by mouth daily.     No current facility-administered medications for this visit.      Physical Exam  Blood pressure 112/80, pulse (!) 111, height 6\' 2"  (1.88 m), weight 181 lb (82.1 kg).  Constitutional: overall normal hygiene, normal nutrition, well developed, normal grooming, normal body habitus. Assistive device:sling  Musculoskeletal: gait and station Limp none, muscle tone and strength are normal, no tremors or atrophy is present.  .  Neurological: coordination  overall normal.  Deep tendon reflex/nerve stretch intact.  Sensation normal.  Cranial nerves II-XII intact.   Skin:   Normal overall no scars, lesions, ulcers or rashes. No psoriasis.  Psychiatric: Alert and oriented x 3.  Recent memory intact, remote memory unclear.  Normal mood and affect. Well groomed.  Good eye contact.  Cardiovascular: overall no swelling, no varicosities, no edema bilaterally, normal temperatures of the legs and arms, no clubbing, cyanosis and good capillary refill.  Lymphatic: palpation is normal.  Examination of left Upper Extremity is done.  Inspection:   Overall:  Elbow non-tender without crepitus or defects, forearm non-tender without crepitus or defects, wrist non-tender without crepitus or defects, hand non-tender.    Shoulder: with glenohumeral joint tenderness, without effusion.   Upper arm: without swelling and tenderness   Range of motion:   Overall:  Full range of motion of the elbow, full range of motion of wrist and full range of motion in fingers.   Shoulder:  left  120 degrees forward flexion; 100 degrees abduction; 25 degrees internal rotation, 25 degrees external rotation, 10 degrees extension, 40 degrees adduction.   Stability:   Overall:  Shoulder, elbow and wrist stable   Strength and Tone:   Overall full shoulder muscles strength, full upper arm strength and normal upper arm bulk and tone.  All other systems reviewed and are negative   The patient has been educated about the nature of the problem(s) and counseled on treatment options.  The patient appeared to understand what I have discussed and is in agreement with it.  Encounter Diagnosis  Name Primary?  . Acute pain of left shoulder Yes    PLAN Call if any problems.  Precautions discussed.  Continue current medications.   Return to clinic 2 weeks   I have reviewed the [facility] Controlled Substance Reporting System web site prior to prescribing narcotic medicine for this  patient.    Begin PT.  Electronically Signed Darreld Mclean, MD 9/26/20199:55 AM

## 2018-06-07 NOTE — Patient Instructions (Signed)
Out of work another two weeks. 

## 2018-06-07 NOTE — Addendum Note (Signed)
Addended by: Baird Kay on: 06/07/2018 10:01 AM   Modules accepted: Orders

## 2018-06-18 ENCOUNTER — Telehealth: Payer: Self-pay | Admitting: Orthopaedic Surgery

## 2018-06-18 NOTE — Telephone Encounter (Signed)
Patient requests refill on Hydrocodone/Acetaminophen 7.5-325 mgs.   Qty  60  Sig: One tablet every four hours as needed for pain.  Patient uses [facility] [address]. In [place], [place]

## 2018-06-19 MED ORDER — HYDROCODONE-ACETAMINOPHEN 7.5-325 MG PO TABS: ORAL_TABLET | ORAL | 0 refills | Status: DC

## 2018-06-21 ENCOUNTER — Ambulatory Visit: Payer: BLUE CROSS/BLUE SHIELD | Admitting: Orthopaedic Surgery

## 2018-06-21 ENCOUNTER — Other Ambulatory Visit: Payer: Self-pay | Admitting: Family Medicine

## 2018-06-27 ENCOUNTER — Encounter: Payer: Self-pay | Admitting: Orthopaedic Surgery

## 2018-06-27 ENCOUNTER — Ambulatory Visit: Payer: BLUE CROSS/BLUE SHIELD | Admitting: Orthopaedic Surgery

## 2018-06-27 VITALS — BP 113/78 | HR 98 | Ht 70.5 in | Wt 184.0 lb

## 2018-06-27 DIAGNOSIS — G8929 Other chronic pain: Secondary | ICD-10-CM

## 2018-06-27 DIAGNOSIS — M25512 Pain in left shoulder: Secondary | ICD-10-CM

## 2018-06-27 MED ORDER — HYDROCODONE-ACETAMINOPHEN 7.5-325 MG PO TABS: ORAL_TABLET | ORAL | 0 refills | Status: DC
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
## 2018-06-27 NOTE — Patient Instructions (Signed)
Out of work next three weeks. 

## 2018-07-04 ENCOUNTER — Other Ambulatory Visit: Payer: Self-pay | Admitting: Family Medicine

## 2018-07-05 NOTE — Telephone Encounter (Signed)
Ok to refill??  Last office visit 06/07/2018.   Last refill 04/10/2018, #2 refills.

## 2018-07-18 ENCOUNTER — Ambulatory Visit: Payer: BLUE CROSS/BLUE SHIELD | Admitting: Orthopaedic Surgery

## 2018-07-18 ENCOUNTER — Encounter: Payer: Self-pay | Admitting: Orthopaedic Surgery

## 2018-07-18 VITALS — BP 122/80 | HR 94 | Ht 70.5 in | Wt 181.0 lb

## 2018-07-18 DIAGNOSIS — M25512 Pain in left shoulder: Secondary | ICD-10-CM | POA: Diagnosis not present

## 2018-07-18 DIAGNOSIS — G8929 Other chronic pain: Secondary | ICD-10-CM | POA: Diagnosis not present

## 2018-07-18 MED ORDER — HYDROCODONE-ACETAMINOPHEN 5-325 MG PO TABS: ORAL_TABLET | ORAL | 0 refills | Status: DC

## 2018-07-18 NOTE — Patient Instructions (Signed)
Full duty work July 30, 2018
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
## 2018-07-24 ENCOUNTER — Other Ambulatory Visit: Payer: Self-pay | Admitting: Family Medicine

## 2018-07-29 ENCOUNTER — Other Ambulatory Visit: Payer: Self-pay | Admitting: Family Medicine

## 2018-08-15 ENCOUNTER — Encounter: Payer: Self-pay | Admitting: Family Medicine

## 2018-08-15 ENCOUNTER — Ambulatory Visit: Payer: BLUE CROSS/BLUE SHIELD | Admitting: Orthopaedic Surgery

## 2018-08-15 ENCOUNTER — Other Ambulatory Visit: Payer: Self-pay

## 2018-08-15 ENCOUNTER — Ambulatory Visit: Payer: BLUE CROSS/BLUE SHIELD | Admitting: Family Medicine

## 2018-08-15 VITALS — BP 124/74 | HR 84 | Temp 98.3°F | Resp 16 | Ht 70.5 in | Wt 185.0 lb

## 2018-08-15 DIAGNOSIS — F419 Anxiety disorder, unspecified: Secondary | ICD-10-CM

## 2018-08-15 DIAGNOSIS — M25512 Pain in left shoulder: Secondary | ICD-10-CM | POA: Diagnosis not present

## 2018-08-15 DIAGNOSIS — F5101 Primary insomnia: Secondary | ICD-10-CM | POA: Diagnosis not present

## 2018-08-15 DIAGNOSIS — F329 Major depressive disorder, single episode, unspecified: Secondary | ICD-10-CM

## 2018-08-15 DIAGNOSIS — F32A Depression, unspecified: Secondary | ICD-10-CM

## 2018-08-15 DIAGNOSIS — G8929 Other chronic pain: Secondary | ICD-10-CM

## 2018-08-15 MED ORDER — TIZANIDINE HCL 4 MG PO TABS: 4.0000 mg | ORAL_TABLET | Freq: Two times a day (BID) | ORAL | 0 refills | Status: DC | PRN

## 2018-08-15 MED ORDER — IBUPROFEN 800 MG PO TABS: 800.0000 mg | ORAL_TABLET | Freq: Three times a day (TID) | ORAL | 2 refills | Status: DC

## 2018-08-15 MED ORDER — NORTRIPTYLINE HCL 25 MG PO CAPS: 75.0000 mg | ORAL_CAPSULE | Freq: Every day | ORAL | 0 refills | Status: DC
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
## 2018-08-15 NOTE — Patient Instructions (Addendum)
F/U Feb or March for Physical zanaflex for spasm Ibuprofen for shoulder

## 2018-08-15 NOTE — Assessment & Plan Note (Signed)
She has been on multiple meds, I dont see anything else to try or add on Her sleep is chronic long standing problem, higer doses of pamelor causes her side effects, she is on benzo TID, would not increase On SNRI as well and abilify which has helped some Discussed psychiatry for medication management she declines No changes made in meds today

## 2018-08-16 MED ORDER — IBUPROFEN 800 MG PO TABS: 800.0000 mg | ORAL_TABLET | Freq: Three times a day (TID) | ORAL | 2 refills | Status: AC

## 2018-08-16 NOTE — Addendum Note (Signed)
Addended by: Phillips OdorSIX, Woodroe Vogan H on: 08/16/2018 10:16 AM   Modules accepted: Orders

## 2018-08-22 ENCOUNTER — Ambulatory Visit: Payer: BLUE CROSS/BLUE SHIELD | Admitting: Orthopaedic Surgery

## 2018-08-30 ENCOUNTER — Other Ambulatory Visit: Payer: Self-pay | Admitting: Family Medicine

## 2018-08-30 NOTE — Telephone Encounter (Signed)
Ok to refill 

## 2018-09-24 ENCOUNTER — Other Ambulatory Visit: Payer: Self-pay | Admitting: Family Medicine

## 2018-09-28 ENCOUNTER — Other Ambulatory Visit: Payer: Self-pay | Admitting: Family Medicine

## 2018-09-28 NOTE — Telephone Encounter (Signed)
Ok to refill 

## 2018-09-28 NOTE — Telephone Encounter (Signed)
Ok to refill??  Last office visit 08/15/2018.  Last refill 07/05/2018, #2 refills.

## 2018-10-13 ENCOUNTER — Other Ambulatory Visit: Payer: Self-pay | Admitting: Family Medicine

## 2018-10-15 NOTE — Telephone Encounter (Signed)
Ok to refill 

## 2018-10-29 ENCOUNTER — Encounter: Payer: BLUE CROSS/BLUE SHIELD | Admitting: Family Medicine

## 2018-11-06 ENCOUNTER — Other Ambulatory Visit: Payer: Self-pay | Admitting: Family Medicine

## 2018-11-06 NOTE — Telephone Encounter (Signed)
Ok to refill 

## 2018-11-13 ENCOUNTER — Other Ambulatory Visit: Payer: Self-pay | Admitting: Family Medicine

## 2018-11-17 ENCOUNTER — Other Ambulatory Visit: Payer: Self-pay | Admitting: Family Medicine

## 2018-11-21 ENCOUNTER — Other Ambulatory Visit: Payer: Self-pay | Admitting: Family Medicine

## 2018-11-28 ENCOUNTER — Encounter: Payer: BLUE CROSS/BLUE SHIELD | Admitting: Family Medicine

## 2018-12-02 ENCOUNTER — Other Ambulatory Visit: Payer: Self-pay | Admitting: Family Medicine

## 2018-12-03 NOTE — Telephone Encounter (Signed)
Ok to refill 

## 2018-12-24 ENCOUNTER — Other Ambulatory Visit: Payer: Self-pay | Admitting: Family Medicine

## 2018-12-24 NOTE — Telephone Encounter (Signed)
Ok to refill??  Last office visit 08/15/2018.  Last refill 09/20/2018, #2 refills.

## 2019-01-15 ENCOUNTER — Encounter: Payer: BLUE CROSS/BLUE SHIELD | Admitting: Family Medicine

## 2019-01-17 ENCOUNTER — Other Ambulatory Visit: Payer: Self-pay | Admitting: Family Medicine

## 2019-03-18 ENCOUNTER — Other Ambulatory Visit: Payer: Self-pay | Admitting: Family Medicine

## 2019-03-18 NOTE — Telephone Encounter (Signed)
Ok to refill??  Last office visit 08/15/2018.  Last refill 12/24/2018, #2 refills.

## 2019-03-28 ENCOUNTER — Other Ambulatory Visit: Payer: Self-pay | Admitting: Family Medicine
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
## 2019-04-01 ENCOUNTER — Encounter: Payer: BLUE CROSS/BLUE SHIELD | Admitting: Family Medicine

## 2019-04-18 ENCOUNTER — Other Ambulatory Visit: Payer: Self-pay

## 2019-04-19 ENCOUNTER — Ambulatory Visit (INDEPENDENT_AMBULATORY_CARE_PROVIDER_SITE_OTHER): Payer: BC Managed Care – PPO | Admitting: Family Medicine

## 2019-04-19 ENCOUNTER — Encounter: Payer: Self-pay | Admitting: Family Medicine

## 2019-04-19 VITALS — BP 122/80 | HR 88 | Temp 97.7°F | Resp 14 | Ht 70.5 in | Wt 194.0 lb

## 2019-04-19 DIAGNOSIS — E7800 Pure hypercholesterolemia, unspecified: Secondary | ICD-10-CM | POA: Diagnosis not present

## 2019-04-19 DIAGNOSIS — E559 Vitamin D deficiency, unspecified: Secondary | ICD-10-CM | POA: Diagnosis not present

## 2019-04-19 DIAGNOSIS — F331 Major depressive disorder, recurrent, moderate: Secondary | ICD-10-CM

## 2019-04-19 DIAGNOSIS — F419 Anxiety disorder, unspecified: Secondary | ICD-10-CM | POA: Diagnosis not present

## 2019-04-19 DIAGNOSIS — F5101 Primary insomnia: Secondary | ICD-10-CM

## 2019-04-19 DIAGNOSIS — F32A Depression, unspecified: Secondary | ICD-10-CM

## 2019-04-19 DIAGNOSIS — R252 Cramp and spasm: Secondary | ICD-10-CM

## 2019-04-19 DIAGNOSIS — F329 Major depressive disorder, single episode, unspecified: Secondary | ICD-10-CM

## 2019-04-19 DIAGNOSIS — R5383 Other fatigue: Secondary | ICD-10-CM

## 2019-04-19 MED ORDER — VENLAFAXINE HCL 100 MG PO TABS: 100.0000 mg | ORAL_TABLET | Freq: Two times a day (BID) | ORAL | 3 refills | Status: DC

## 2019-04-19 MED ORDER — NORTRIPTYLINE HCL 25 MG PO CAPS: 75.0000 mg | ORAL_CAPSULE | Freq: Every day | ORAL | 2 refills | Status: DC

## 2019-04-19 MED ORDER — TIZANIDINE HCL 4 MG PO TABS: 4.0000 mg | ORAL_TABLET | Freq: Two times a day (BID) | ORAL | 1 refills | Status: DC | PRN

## 2019-04-19 NOTE — Patient Instructions (Signed)
F/U 6 months for Physical  

## 2019-04-19 NOTE — Assessment & Plan Note (Signed)
We will check her vitamin levels.  She does have some chronic fatigue has been vitamin deficient in the past.  She also noted that she had some constipation she thought was coming from her B12 but also takes multivitamin with iron.  Advised most likely her diet but she could try stopping the vitamin with iron and taking one without and seeing how her stomach does.

## 2019-04-19 NOTE — Progress Notes (Signed)
Subjective:    Patient ID: Traci Pratt, female    DOB: [birth year], 53 y.o.   MRN: [id number]  Patient presents for Medication Review/ Refills (is not fasting)  Pt here to f/u chronic medical problems  Had soda around 1:56m but no meal   Has chronic fatigue  Sleeping about 4-5 hours    Taking pamelor and xanax as prescribed for her depression and her sleep.  She still has not rekindled her relationship with her son which does continue to bother her. She has gained 10 pounds during the [place] quarantine.  She knows that she is drinking way too much soda often has 3 or 4 Dr. Samson Frederic a day.  Is also not eating the healthiest foods.  Does find herself gassy at times bloated other times has cramps in her legs she does not drink very much water   Due for refills on mediations   Review Of Systems:  GEN- + fatigue, denies fever, weight loss,weakness, recent illness HEENT- denies eye drainage, change in vision, nasal discharge, CVS- denies chest pain, palpitations RESP- denies SOB, cough, wheeze ABD- denies N/V, change in stools, abd pain GU- denies dysuria, hematuria, dribbling, incontinence MSK- denies joint pain,+ muscle aches, injury Neuro- denies headache, dizziness, syncope, seizure activity       Objective:    BP 122/80   Pulse 88   Temp 97.7 F (36.5 C) (Oral)   Resp 14   Ht 5' 10.5" (1.791 m)   Wt 194 lb (88 kg)   SpO2 98%   BMI 27.44 kg/m  GEN- NAD, alert and oriented x3 HEENT- PERRL, EOMI, non injected sclera, pink conjunctiva, MMM, oropharynx clear Neck- Supple, no thyromegaly CVS- RRR, no murmur RESP-CTAB ABD-NABS,soft,NT,ND Psych-normal affect and mood  EXT- No edema Pulses- Radial, DP- 2+        Assessment & Plan:      Problem List Items Addressed This Visit      Unprioritized   Anxiety and depression    Major depression along with anxiety chronic insomnia.  Continue her current medication regimen.  This is the best we been able to find  with all of the different medications tried to help with her symptoms.  Discussed dietary changes exercise have this also helps with her mood.      Relevant Medications   nortriptyline (PAMELOR) 25 MG capsule   venlafaxine (EFFEXOR) 100 MG tablet   Depression   Relevant Medications   nortriptyline (PAMELOR) 25 MG capsule   venlafaxine (EFFEXOR) 100 MG tablet   Hyperlipidemia   Relevant Orders   Comprehensive metabolic panel   Lipid panel   Insomnia   Vitamin D deficiency - Primary    We will check her vitamin levels.  She does have some chronic fatigue has been vitamin deficient in the past.  She also noted that she had some constipation she thought was coming from her B12 but also takes multivitamin with iron.  Advised most likely her diet but she could try stopping the vitamin with iron and taking one without and seeing how her stomach does.      Relevant Orders   Vitamin D, 25-hydroxy    Other Visit Diagnoses    Fatigue, unspecified type       Relevant Orders   CBC with Differential/Platelet   Vitamin B12   Leg cramps       Increase her water decrease the soda   Relevant Orders   Magnesium      Note:  This dictation was prepared with Dragon dictation along with smaller phrase technology. Any transcriptional errors that result from this process are unintentional.

## 2019-04-19 NOTE — Assessment & Plan Note (Signed)
Major depression along with anxiety chronic insomnia.  Continue her current medication regimen.  This is the best we been able to find with all of the different medications tried to help with her symptoms.  Discussed dietary changes exercise have this also helps with her mood.

## 2019-04-20 LAB — COMPREHENSIVE METABOLIC PANEL
AG Ratio: 1.5 (calc) (ref 1.0–2.5)
ALT: 21 U/L (ref 6–29)
AST: 19 U/L (ref 10–35)
Albumin: 4.1 g/dL (ref 3.6–5.1)
Alkaline phosphatase (APISO): 96 U/L (ref 37–153)
BUN: 10 mg/dL (ref 7–25)
CO2: 30 mmol/L (ref 20–32)
Calcium: 9.4 mg/dL (ref 8.6–10.4)
Chloride: 103 mmol/L (ref 98–110)
Creat: 0.81 mg/dL (ref 0.50–1.05)
Globulin: 2.8 g/dL (calc) (ref 1.9–3.7)
Glucose, Bld: 85 mg/dL (ref 65–99)
Potassium: 4.1 mmol/L (ref 3.5–5.3)
Sodium: 138 mmol/L (ref 135–146)
Total Bilirubin: 0.3 mg/dL (ref 0.2–1.2)
Total Protein: 6.9 g/dL (ref 6.1–8.1)

## 2019-04-20 LAB — CBC WITH DIFFERENTIAL/PLATELET
Absolute Monocytes: 331 cells/uL (ref 200–950)
Basophils Absolute: 17 cells/uL (ref 0–200)
Basophils Relative: 0.3 %
Eosinophils Absolute: 168 cells/uL (ref 15–500)
Eosinophils Relative: 2.9 %
HCT: 39.9 % (ref 35.0–45.0)
Hemoglobin: 13.6 g/dL (ref 11.7–15.5)
Lymphs Abs: 2604 cells/uL (ref 850–3900)
MCH: 30.2 pg (ref 27.0–33.0)
MCHC: 34.1 g/dL (ref 32.0–36.0)
MCV: 88.5 fL (ref 80.0–100.0)
MPV: 11.5 fL (ref 7.5–12.5)
Monocytes Relative: 5.7 %
Neutro Abs: 2680 cells/uL (ref 1500–7800)
Neutrophils Relative %: 46.2 %
Platelets: 208 10*3/uL (ref 140–400)
RBC: 4.51 10*6/uL (ref 3.80–5.10)
RDW: 13 % (ref 11.0–15.0)
Total Lymphocyte: 44.9 %
WBC: 5.8 10*3/uL (ref 3.8–10.8)

## 2019-04-20 LAB — LIPID PANEL
Cholesterol: 185 mg/dL (ref <200)
HDL: 58 mg/dL (ref >=50)
LDL Cholesterol (Calc): 112 mg/dL (calc) — ABNORMAL HIGH
Non-HDL Cholesterol (Calc): 127 mg/dL (calc) (ref <130)
Total CHOL/HDL Ratio: 3.2 (calc) (ref <5.0)
Triglycerides: 60 mg/dL (ref <150)

## 2019-04-20 LAB — MAGNESIUM: Magnesium: 2 mg/dL (ref 1.5–2.5)

## 2019-04-20 LAB — VITAMIN B12: Vitamin B-12: 854 pg/mL (ref 200–1100)

## 2019-04-20 LAB — VITAMIN D 25 HYDROXY (VIT D DEFICIENCY, FRACTURES): Vit D, 25-Hydroxy: 39 ng/mL (ref 30–100)

## 2019-04-23 ENCOUNTER — Other Ambulatory Visit: Payer: Self-pay | Admitting: Family Medicine

## 2019-04-23 DIAGNOSIS — Z1231 Encounter for screening mammogram for malignant neoplasm of breast: Secondary | ICD-10-CM

## 2019-04-27 ENCOUNTER — Other Ambulatory Visit: Payer: Self-pay | Admitting: Family Medicine

## 2019-04-29 NOTE — Telephone Encounter (Signed)
Ok to refill 

## 2019-05-17 ENCOUNTER — Other Ambulatory Visit: Payer: Self-pay | Admitting: Family Medicine

## 2019-05-28 ENCOUNTER — Other Ambulatory Visit: Payer: Self-pay | Admitting: Family Medicine

## 2019-05-28 NOTE — Telephone Encounter (Signed)
Ok to refill 

## 2019-06-05 ENCOUNTER — Other Ambulatory Visit: Payer: Self-pay | Admitting: Family Medicine

## 2019-06-05 NOTE — Telephone Encounter (Signed)
Ok to refill 

## 2019-06-06 ENCOUNTER — Ambulatory Visit: Payer: BLUE CROSS/BLUE SHIELD

## 2019-06-15 ENCOUNTER — Other Ambulatory Visit: Payer: Self-pay | Admitting: Family Medicine

## 2019-06-17 NOTE — Telephone Encounter (Signed)
Ok to refill??  Last office visit 04/19/2019.  Last refill 03/18/2019, #2 refills.

## 2019-06-24 ENCOUNTER — Other Ambulatory Visit: Payer: Self-pay | Admitting: Family Medicine

## 2019-06-24 NOTE — Telephone Encounter (Signed)
Ok to refill 

## 2019-07-08 ENCOUNTER — Other Ambulatory Visit: Payer: Self-pay | Admitting: Family Medicine

## 2019-07-08 NOTE — Telephone Encounter (Signed)
Ok to refill 

## 2019-07-19 ENCOUNTER — Other Ambulatory Visit: Payer: Self-pay

## 2019-07-19 ENCOUNTER — Ambulatory Visit
Admission: RE | Admit: 2019-07-19 | Discharge: 2019-07-19 | Disposition: A | Discharge status: Home / Self Care | Payer: BC Managed Care – PPO | Source: Ambulatory Visit | Attending: Family Medicine | Admitting: Family Medicine

## 2019-07-19 DIAGNOSIS — Z1231 Encounter for screening mammogram for malignant neoplasm of breast: Secondary | ICD-10-CM

## 2019-07-31 ENCOUNTER — Other Ambulatory Visit: Payer: Self-pay | Admitting: Family Medicine

## 2019-07-31 NOTE — Telephone Encounter (Signed)
Ok to refill 

## 2019-08-16 ENCOUNTER — Other Ambulatory Visit: Payer: Self-pay | Admitting: Family Medicine

## 2019-08-16 NOTE — Telephone Encounter (Signed)
Ok to refill 

## 2019-08-21 ENCOUNTER — Encounter: Payer: Self-pay | Admitting: Family Medicine

## 2019-08-26 ENCOUNTER — Other Ambulatory Visit: Payer: Self-pay | Admitting: Family Medicine

## 2019-08-30 ENCOUNTER — Other Ambulatory Visit: Payer: Self-pay | Admitting: Family Medicine

## 2019-08-30 NOTE — Telephone Encounter (Signed)
Ok to refill 

## 2019-09-04 ENCOUNTER — Telehealth: Payer: Self-pay | Admitting: Orthopaedic Surgery

## 2019-09-04 NOTE — Telephone Encounter (Signed)
Called back to patient in response to voice message left - requested to schedule appointment for hand pain. Left message to return call.

## 2019-09-07 ENCOUNTER — Other Ambulatory Visit: Payer: Self-pay | Admitting: Family Medicine

## 2019-09-12 ENCOUNTER — Other Ambulatory Visit: Payer: Self-pay | Admitting: Family Medicine

## 2019-09-12 NOTE — Telephone Encounter (Signed)
Ok to refill 

## 2019-09-12 NOTE — Telephone Encounter (Signed)
Ok to refill??  Last office visit 04/19/2019.  Last refill 06/18/2019, #2 refills.

## 2019-09-16 ENCOUNTER — Other Ambulatory Visit: Payer: Self-pay

## 2019-09-16 NOTE — Telephone Encounter (Signed)
Requested Prescriptions   Pending Prescriptions Disp Refills  . ALPRAZolam (XANAX) 1 MG tablet 90 tablet 0    Sig: Take 1 tablet (1 mg total) by mouth 3 (three) times daily as needed.     Last OV 04/19/2019   Last written 06/18/2019

## 2019-09-16 NOTE — Telephone Encounter (Signed)
Medication refilled

## 2019-09-16 NOTE — Telephone Encounter (Signed)
Please call patient when this has been sent in. She is completely out of medication.  CB# [phone]

## 2019-09-19 ENCOUNTER — Other Ambulatory Visit: Payer: Self-pay | Admitting: Family Medicine

## 2019-09-19 NOTE — Telephone Encounter (Signed)
Ok to refill 

## 2019-09-30 ENCOUNTER — Other Ambulatory Visit: Payer: Self-pay | Admitting: Family Medicine

## 2019-09-30 NOTE — Telephone Encounter (Signed)
Ok to refill 

## 2019-10-15 ENCOUNTER — Other Ambulatory Visit: Payer: Self-pay | Admitting: Family Medicine

## 2019-10-15 NOTE — Telephone Encounter (Signed)
Ok to refill 

## 2019-10-21 ENCOUNTER — Other Ambulatory Visit: Payer: Self-pay | Admitting: Family Medicine

## 2019-10-21 ENCOUNTER — Encounter: Payer: BC Managed Care – PPO | Admitting: Family Medicine

## 2019-10-23 ENCOUNTER — Other Ambulatory Visit: Payer: Self-pay | Admitting: Family Medicine

## 2019-11-21 ENCOUNTER — Other Ambulatory Visit: Payer: Self-pay | Admitting: Family Medicine

## 2019-11-21 NOTE — Telephone Encounter (Signed)
Ok to refill 

## 2019-11-30 ENCOUNTER — Other Ambulatory Visit: Payer: Self-pay | Admitting: Family Medicine

## 2019-12-02 NOTE — Telephone Encounter (Signed)
Ok to refill 

## 2019-12-11 ENCOUNTER — Other Ambulatory Visit: Payer: Self-pay | Admitting: Family Medicine

## 2019-12-11 NOTE — Telephone Encounter (Signed)
Pt is requesting refill on Xanax   LOV: 04/19/19  LRF:  09/16/2019

## 2019-12-18 ENCOUNTER — Other Ambulatory Visit: Payer: Self-pay | Admitting: Family Medicine

## 2019-12-18 NOTE — Telephone Encounter (Signed)
Requesting refill    Tizanidine  LOV: 04/19/2019  LRF: 12/02/2019

## 2020-01-01 ENCOUNTER — Other Ambulatory Visit: Payer: Self-pay | Admitting: Family Medicine

## 2020-01-01 NOTE — Telephone Encounter (Signed)
Ok to refill 

## 2020-01-08 ENCOUNTER — Ambulatory Visit (INDEPENDENT_AMBULATORY_CARE_PROVIDER_SITE_OTHER): Payer: BC Managed Care – PPO | Admitting: Family Medicine

## 2020-01-08 ENCOUNTER — Other Ambulatory Visit: Payer: Self-pay

## 2020-01-08 ENCOUNTER — Encounter: Payer: Self-pay | Admitting: Family Medicine

## 2020-01-08 ENCOUNTER — Telehealth: Payer: Self-pay | Admitting: *Deleted

## 2020-01-08 VITALS — BP 126/82 | HR 82 | Temp 98.1°F | Resp 14 | Ht 70.5 in | Wt 188.0 lb

## 2020-01-08 DIAGNOSIS — B009 Herpesviral infection, unspecified: Secondary | ICD-10-CM | POA: Insufficient documentation

## 2020-01-08 DIAGNOSIS — Z124 Encounter for screening for malignant neoplasm of cervix: Secondary | ICD-10-CM | POA: Diagnosis not present

## 2020-01-08 DIAGNOSIS — Z0001 Encounter for general adult medical examination with abnormal findings: Secondary | ICD-10-CM | POA: Diagnosis not present

## 2020-01-08 DIAGNOSIS — E7800 Pure hypercholesterolemia, unspecified: Secondary | ICD-10-CM | POA: Diagnosis not present

## 2020-01-08 DIAGNOSIS — F419 Anxiety disorder, unspecified: Secondary | ICD-10-CM

## 2020-01-08 DIAGNOSIS — F5101 Primary insomnia: Secondary | ICD-10-CM

## 2020-01-08 DIAGNOSIS — F329 Major depressive disorder, single episode, unspecified: Secondary | ICD-10-CM

## 2020-01-08 DIAGNOSIS — Z0000 Encounter for general adult medical examination without abnormal findings: Secondary | ICD-10-CM | POA: Diagnosis not present

## 2020-01-08 DIAGNOSIS — E559 Vitamin D deficiency, unspecified: Secondary | ICD-10-CM

## 2020-01-08 DIAGNOSIS — F331 Major depressive disorder, recurrent, moderate: Secondary | ICD-10-CM

## 2020-01-08 DIAGNOSIS — F32A Depression, unspecified: Secondary | ICD-10-CM

## 2020-01-08 MED ORDER — VENLAFAXINE HCL 100 MG PO TABS: 100.0000 mg | ORAL_TABLET | Freq: Two times a day (BID) | ORAL | 3 refills | Status: DC

## 2020-01-08 MED ORDER — VALACYCLOVIR HCL 500 MG PO TABS: 500.0000 mg | ORAL_TABLET | Freq: Every day | ORAL | 2 refills | Status: AC

## 2020-01-08 MED ORDER — VALACYCLOVIR HCL 500 MG PO TABS: 500.0000 mg | ORAL_TABLET | Freq: Every day | ORAL | 11 refills | Status: DC

## 2020-01-08 MED ORDER — SHINGRIX 50 MCG/0.5ML IM SUSR: 0.5000 mL | Freq: Once | INTRAMUSCULAR | 1 refills | Status: AC

## 2020-01-08 NOTE — Telephone Encounter (Signed)
-----   Message from Salley Scarlet, MD sent at 01/08/2020  4:14 PM EDT ----- Regarding: send shingles vaccine, cologuard

## 2020-01-08 NOTE — Progress Notes (Signed)
Subjective:    Patient ID: Traci Pratt, female    DOB: [birth year], 54 y.o.   MRN: [id number]  Patient presents for Gynecologic Exam (is not fasting)  Patient here for complete physical exam.  Medications reviewed.  Mammogram up-to-date in November 2020  PAP Smear due- last  2017   Due for Colonoscopy   No menses   Immunizations- TDAP/COVID/FLU UTD  discussed shingles vaccine    [name] - has contacts   Pt reqest  suppressive therapy for her history of HSV.  States she has had some outbreaks intermittently over the past year seems to be getting more will prefer to be on suppressive therapy.  Denies any current vaginal discharge or current outbreak   Depression with anxiety she states that the stress has been lifted as she now has good relationship with her son.  He will be returning back to [place] when he is discharged from the [employer] next week.  She states today getting along so much better and talking she is able to spend time with her grandchild as well.  Her sleep is also improved. Review Of Systems:  GEN- denies fatigue, fever, weight loss,weakness, recent illness HEENT- denies eye drainage, change in vision, nasal discharge, CVS- denies chest pain, palpitations RESP- denies SOB, cough, wheeze ABD- denies N/V, change in stools, abd pain GU- denies dysuria, hematuria, dribbling, incontinence MSK- denies joint pain, muscle aches, injury Neuro- denies headache, dizziness, syncope, seizure activity       Objective:    BP 126/82   Pulse 82   Temp 98.1 F (36.7 C) (Temporal)   Resp 14   Ht 5' 10.5" (1.791 m)   Wt 188 lb (85.3 kg)   SpO2 99%   BMI 26.59 kg/m  GEN- NAD, alert and oriented x3 ,weight down 6lbs since last august  HEENT- PERRL, EOMI, non injected sclera, pink conjunctiva, MMM, oropharynx clear Neck- Supple, no thyromegaly Breast- normal symmetry, no nipple inversion,no nipple drainage, no nodules or lumps  felt Nodes- no axillary nodes CVS- RRR, no murmur RESP-CTAB ABD-NABS,soft,NT,ND GU- normal external genitalia, vaginal mucosa pink and moist, cervix visualized no growth, no blood form os, minimal thin clear discharge, no CMT, no ovarian masses, uterus normal size EXT- No edema Pulses- Radial, DP- 2+        Assessment & Plan:      Problem List Items Addressed This Visit      Unprioritized   Anxiety    Depression and anxiety symptoms have improved that she has rekindled a relationship with her son and she is able to see her grandchild.  No change in her medications today.      Relevant Medications   venlafaxine (EFFEXOR) 100 MG tablet   RESOLVED: Anxiety and depression   Relevant Medications   venlafaxine (EFFEXOR) 100 MG tablet   Depression   Relevant Medications   venlafaxine (EFFEXOR) 100 MG tablet   HSV infection    Start suppressive therapy with Valtrex daily      Relevant Medications   valACYclovir (VALTREX) 500 MG tablet   Hyperlipidemia    Continue current medications we will recheck lipid panel.  We did discuss some nutrition she does drink a large amount of soda and rarely any water.      Insomnia   Vitamin D deficiency   Relevant Orders   Vitamin D, 25-hydroxy    Other Visit Diagnoses    Routine general medical examination at a health care facility    -  Primary   Cologuard to be sent, Fasting labs, shingles vaccine to pharmacy   Relevant Orders   CBC with Differential/Platelet   Comprehensive metabolic panel   Lipid panel   Cervical cancer screening       Relevant Orders   Pap IG w/ reflex to HPV when ASC-U      Note: This dictation was prepared with Dragon dictation along with smaller phrase technology. Any transcriptional errors that result from this process are unintentional.

## 2020-01-08 NOTE — Assessment & Plan Note (Signed)
Continue current medications we will recheck lipid panel.  We did discuss some nutrition she does drink a large amount of soda and rarely any water.

## 2020-01-08 NOTE — Patient Instructions (Signed)
We will call with lab results Send back the cologuard  Shingles vaccine to pharmacy  F/U 6 months for medications

## 2020-01-08 NOTE — Assessment & Plan Note (Signed)
Depression and anxiety symptoms have improved that she has rekindled a relationship with her son and she is able to see her grandchild.  No change in her medications today.

## 2020-01-08 NOTE — Assessment & Plan Note (Signed)
Start suppressive therapy with Valtrex daily

## 2020-01-09 ENCOUNTER — Telehealth: Payer: Self-pay | Admitting: *Deleted

## 2020-01-09 LAB — COMPREHENSIVE METABOLIC PANEL
AG Ratio: 1.7 (calc) (ref 1.0–2.5)
ALT: 26 U/L (ref 6–29)
AST: 19 U/L (ref 10–35)
Albumin: 4.1 g/dL (ref 3.6–5.1)
Alkaline phosphatase (APISO): 108 U/L (ref 37–153)
BUN: 8 mg/dL (ref 7–25)
CO2: 32 mmol/L (ref 20–32)
Calcium: 9.3 mg/dL (ref 8.6–10.4)
Chloride: 103 mmol/L (ref 98–110)
Creat: 0.74 mg/dL (ref 0.50–1.05)
Globulin: 2.4 g/dL (calc) (ref 1.9–3.7)
Glucose, Bld: 85 mg/dL (ref 65–99)
Potassium: 3.6 mmol/L (ref 3.5–5.3)
Sodium: 140 mmol/L (ref 135–146)
Total Bilirubin: 0.3 mg/dL (ref 0.2–1.2)
Total Protein: 6.5 g/dL (ref 6.1–8.1)

## 2020-01-09 LAB — CBC WITH DIFFERENTIAL/PLATELET
Absolute Monocytes: 381 cells/uL (ref 200–950)
Basophils Absolute: 7 cells/uL (ref 0–200)
Basophils Relative: 0.1 %
Eosinophils Absolute: 197 cells/uL (ref 15–500)
Eosinophils Relative: 2.9 %
HCT: 39.1 % (ref 35.0–45.0)
Hemoglobin: 13 g/dL (ref 11.7–15.5)
Lymphs Abs: 3380 cells/uL (ref 850–3900)
MCH: 29.4 pg (ref 27.0–33.0)
MCHC: 33.2 g/dL (ref 32.0–36.0)
MCV: 88.5 fL (ref 80.0–100.0)
MPV: 10.2 fL (ref 7.5–12.5)
Monocytes Relative: 5.6 %
Neutro Abs: 2836 cells/uL (ref 1500–7800)
Neutrophils Relative %: 41.7 %
Platelets: 303 10*3/uL (ref 140–400)
RBC: 4.42 10*6/uL (ref 3.80–5.10)
RDW: 12.9 % (ref 11.0–15.0)
Total Lymphocyte: 49.7 %
WBC: 6.8 10*3/uL (ref 3.8–10.8)

## 2020-01-09 LAB — LIPID PANEL
Cholesterol: 174 mg/dL (ref <200)
HDL: 56 mg/dL (ref >=50)
LDL Cholesterol (Calc): 101 mg/dL (calc) — ABNORMAL HIGH
Non-HDL Cholesterol (Calc): 118 mg/dL (calc) (ref <130)
Total CHOL/HDL Ratio: 3.1 (calc) (ref <5.0)
Triglycerides: 82 mg/dL (ref <150)

## 2020-01-09 LAB — VITAMIN D 25 HYDROXY (VIT D DEFICIENCY, FRACTURES): Vit D, 25-Hydroxy: 34 ng/mL (ref 30–100)

## 2020-01-09 NOTE — Telephone Encounter (Signed)
Received verbal orders for Cologuard.   Order placed via [employer].   Cologuard (Order [id number])

## 2020-01-10 ENCOUNTER — Encounter: Payer: Self-pay | Admitting: *Deleted

## 2020-01-10 LAB — PAP IG W/ RFLX HPV ASCU

## 2020-01-11 ENCOUNTER — Other Ambulatory Visit: Payer: Self-pay | Admitting: Family Medicine

## 2020-01-13 NOTE — Telephone Encounter (Signed)
Ok to refill 

## 2020-01-20 ENCOUNTER — Other Ambulatory Visit: Payer: Self-pay | Admitting: Family Medicine

## 2020-01-20 NOTE — Telephone Encounter (Signed)
Ok to refill 

## 2020-02-01 ENCOUNTER — Other Ambulatory Visit: Payer: Self-pay | Admitting: Family Medicine

## 2020-02-07 ENCOUNTER — Other Ambulatory Visit: Payer: Self-pay | Admitting: Family Medicine

## 2020-02-07 NOTE — Telephone Encounter (Signed)
Ok to refill 

## 2020-02-18 ENCOUNTER — Other Ambulatory Visit: Payer: Self-pay | Admitting: Family Medicine

## 2020-02-18 NOTE — Telephone Encounter (Signed)
Ok to refill 

## 2020-02-29 ENCOUNTER — Other Ambulatory Visit: Payer: Self-pay | Admitting: Family Medicine

## 2020-03-02 NOTE — Telephone Encounter (Signed)
Ok to refill 

## 2020-03-11 ENCOUNTER — Other Ambulatory Visit: Payer: Self-pay | Admitting: Family Medicine

## 2020-03-11 NOTE — Telephone Encounter (Signed)
Ok to refill??  Last office visit 01/08/2020.  Last refill 12/12/2019, #2 refills.

## 2020-03-20 ENCOUNTER — Other Ambulatory Visit: Payer: Self-pay | Admitting: Family Medicine

## 2020-03-20 NOTE — Telephone Encounter (Signed)
Medication had been sent previously.

## 2020-04-04 ENCOUNTER — Other Ambulatory Visit: Payer: Self-pay | Admitting: Family Medicine

## 2020-04-06 NOTE — Telephone Encounter (Signed)
Ok to refill 

## 2020-04-17 ENCOUNTER — Other Ambulatory Visit: Payer: Self-pay | Admitting: Family Medicine

## 2020-04-17 NOTE — Telephone Encounter (Signed)
Ok to refill 

## 2020-05-01 ENCOUNTER — Other Ambulatory Visit: Payer: Self-pay | Admitting: Family Medicine

## 2020-05-29 ENCOUNTER — Other Ambulatory Visit: Payer: Self-pay | Admitting: Family Medicine

## 2020-05-29 NOTE — Telephone Encounter (Signed)
Ok to refill 

## 2020-06-05 ENCOUNTER — Other Ambulatory Visit: Payer: Self-pay | Admitting: Family Medicine

## 2020-06-05 NOTE — Telephone Encounter (Signed)
Ok to refill??  Last office visit 01/08/2020.  Last refill 02/13/2020, #2 refills.

## 2020-06-10 ENCOUNTER — Other Ambulatory Visit: Payer: Self-pay | Admitting: Family Medicine

## 2020-06-10 NOTE — Telephone Encounter (Signed)
Ok to refill 

## 2020-06-21 ENCOUNTER — Other Ambulatory Visit: Payer: Self-pay | Admitting: Family Medicine

## 2020-06-22 NOTE — Telephone Encounter (Signed)
Ok to refill 

## 2020-07-02 ENCOUNTER — Other Ambulatory Visit: Payer: Self-pay | Admitting: Family Medicine

## 2020-07-10 ENCOUNTER — Ambulatory Visit: Payer: BC Managed Care – PPO | Admitting: Family Medicine

## 2020-07-14 ENCOUNTER — Other Ambulatory Visit: Payer: Self-pay | Admitting: Family Medicine

## 2020-07-14 NOTE — Telephone Encounter (Signed)
Ok to refill 

## 2020-07-17 ENCOUNTER — Ambulatory Visit: Payer: BC Managed Care – PPO | Admitting: Family Medicine

## 2020-07-29 ENCOUNTER — Other Ambulatory Visit: Payer: Self-pay | Admitting: Family Medicine

## 2020-07-31 ENCOUNTER — Ambulatory Visit (INDEPENDENT_AMBULATORY_CARE_PROVIDER_SITE_OTHER): Payer: BC Managed Care – PPO | Admitting: Family Medicine

## 2020-07-31 ENCOUNTER — Encounter: Payer: Self-pay | Admitting: Family Medicine

## 2020-07-31 ENCOUNTER — Other Ambulatory Visit: Payer: Self-pay

## 2020-07-31 VITALS — BP 120/82 | HR 90 | Temp 97.9°F | Resp 14 | Ht 70.5 in | Wt 190.0 lb

## 2020-07-31 DIAGNOSIS — F419 Anxiety disorder, unspecified: Secondary | ICD-10-CM

## 2020-07-31 DIAGNOSIS — F331 Major depressive disorder, recurrent, moderate: Secondary | ICD-10-CM

## 2020-07-31 DIAGNOSIS — N760 Acute vaginitis: Secondary | ICD-10-CM

## 2020-07-31 DIAGNOSIS — F5101 Primary insomnia: Secondary | ICD-10-CM | POA: Diagnosis not present

## 2020-07-31 DIAGNOSIS — E559 Vitamin D deficiency, unspecified: Secondary | ICD-10-CM | POA: Diagnosis not present

## 2020-07-31 DIAGNOSIS — N898 Other specified noninflammatory disorders of vagina: Secondary | ICD-10-CM

## 2020-07-31 LAB — WET PREP FOR TRICH, YEAST, CLUE

## 2020-07-31 MED ORDER — FLUCONAZOLE 150 MG PO TABS: 150.0000 mg | ORAL_TABLET | Freq: Once | ORAL | 0 refills | Status: AC

## 2020-07-31 MED ORDER — SULFAMETHOXAZOLE-TRIMETHOPRIM 800-160 MG PO TABS: 1.0000 | ORAL_TABLET | Freq: Two times a day (BID) | ORAL | 0 refills | Status: DC

## 2020-07-31 NOTE — Progress Notes (Signed)
   Subjective:    Patient ID: Traci Pratt, female    DOB: February 20, 1966, 54 y.o.   MRN: [id number]  Patient presents for Follow-up (is not fasting)  Patient here for 99-month follow-up on medications.  She has been on treatment for both depression and anxiety for quite some time.  She also has chronic insomnia associated.  She has been well on  Effexor, Abilify and Xanax as needed.  She also uses nortriptyline 75 mg at bedtime for sleep.  Vitamin D deficiency she has been maintained on vitamin D at 1000 international units daily  She does get muscle spasms in the extremities and uses Zanaflex as needed.  Suppressive therapy for HSV with Valtrex, taking without any difficulty   she noticed a knot in the groin area for the past few months, but it hasnt gone down. Sometimes gets blood and pus out   She throught it was an outbreak so she doubled her valtrex for a while but it never went away, no other sores/lesions She has noticed mild vaginal Discharge daily with mild odor, no vaginal bleeding    Review Of Systems:  GEN- denies fatigue, fever, weight loss,weakness, recent illness HEENT- denies eye drainage, change in vision, nasal discharge, CVS- denies chest pain, palpitations RESP- denies SOB, cough, wheeze ABD- denies N/V, change in stools, abd pain GU- denies dysuria, hematuria, dribbling, incontinence MSK- denies joint pain, muscle aches, injury Neuro- denies headache, dizziness, syncope, seizure activity       Objective:    BP 120/82   Pulse 90   Temp 97.9 F (36.6 C) (Temporal)   Resp 14   Ht 5' 10.5" (1.791 m)   Wt 190 lb (86.2 kg)   SpO2 99%   BMI 26.88 kg/m  GEN- NAD, alert and oriented x3 HEENT- PERRL, EOMI, non injected sclera, pink conjunctiva, MMM, oropharynx clear Neck- Supple, no thyromegaly CVS- RRR, no murmur RESP-CTAB ABD-NABS,soft,NT,ND Psych normal affect and mood GU- normal external genitalia, small indurated boil lesions on right mons  pubis mild TTP, dry blood no fluctance vaginal mucosa pink and moist, cervix visualized no growth, no blood form os, minimal thin clear discharge, no CMT, no ovarian masses, uterus normal size EXT- No edema Pulses- Radial 2+        Assessment & Plan:      Problem List Items Addressed This Visit      Unprioritized   Anxiety - Primary   Depression    Doing well on medications No changes to meds       Insomnia    Continue current meds Sleep is stable with regimen      Vitamin D deficiency    Other Visit Diagnoses    Vaginal discharge       non specific bacteria on wet prep, given diflucan after antibiotics    Relevant Orders   WET PREP FOR TRICH, YEAST, CLUE (Completed)   Abscess, vagina       Treat with warm compresses oral antibiotics, if not improved will send to GYN for chronic lesion mons pubis, it does not appear to be a herpetic lesions      Note: This dictation was prepared with Dragon dictation along with smaller phrase technology. Any transcriptional errors that result from this process are unintentional.

## 2020-07-31 NOTE — Patient Instructions (Addendum)
Vitamin D 1000 once a day  Take antibiotics as prescribed F/U 6 months for Physical

## 2020-08-01 ENCOUNTER — Encounter: Payer: Self-pay | Admitting: Family Medicine

## 2020-08-01 NOTE — Assessment & Plan Note (Signed)
Doing well on medications No changes to meds

## 2020-08-01 NOTE — Assessment & Plan Note (Signed)
Continue current meds Sleep is stable with regimen

## 2020-08-09 ENCOUNTER — Other Ambulatory Visit: Payer: Self-pay | Admitting: Family Medicine

## 2020-08-14 ENCOUNTER — Other Ambulatory Visit: Payer: Self-pay | Admitting: Family Medicine

## 2020-08-14 NOTE — Telephone Encounter (Signed)
Ok to refill 

## 2020-08-21 ENCOUNTER — Telehealth: Payer: Self-pay | Admitting: *Deleted

## 2020-08-21 MED ORDER — SULFAMETHOXAZOLE-TRIMETHOPRIM 800-160 MG PO TABS
1.0000 | ORAL_TABLET | Freq: Two times a day (BID) | ORAL | 0 refills | Status: DC
Start: 2020-08-21 — End: 2020-09-25

## 2020-08-21 NOTE — Telephone Encounter (Signed)
Received call from patient.   Reports that she has completed ABTx for abscess in groin, but area has not resolved. States that it has now opened and is draining.   MD please advise.

## 2020-08-21 NOTE — Telephone Encounter (Signed)
   Call pt back, drainage is good that means the pus is coming out.  Make sure abscess isnt any larger and no fever/chills  If it is worse then needs urgent visit today to be seen   If improving but starting to drain- use warm compresses or epson salt soaks in bath tub 2-3 times a day   Refill bactrim DS 1 po BID x 7 days  I can recheck abscess next week if needed

## 2020-08-21 NOTE — Telephone Encounter (Signed)
Call placed to patient and patient made aware.   States that area has not grown in size, but is draining. Reports that she has been using warm compresses to help decrease size.   Prescription sent to pharmacy for ABTx. Advised to F/U if no improvement noted.

## 2020-08-27 ENCOUNTER — Other Ambulatory Visit: Payer: Self-pay | Admitting: Family Medicine

## 2020-08-27 NOTE — Telephone Encounter (Signed)
Ok to refill 

## 2020-08-28 NOTE — Telephone Encounter (Signed)
Call placed to patient.   States that she is not taking medication every day, but she is taking as needed.   Advised if she is not having daily muscle spasms, then quantity should be sufficient for 30 day supply.   Medication denied.

## 2020-08-28 NOTE — Telephone Encounter (Signed)
Please verify with pt how often she is using. I seem to keep getting refill request every few weeks for the muscle relaxer

## 2020-09-06 ENCOUNTER — Other Ambulatory Visit: Payer: Self-pay | Admitting: Family Medicine

## 2020-09-07 NOTE — Telephone Encounter (Signed)
Ok to refill??  Last office visit 07/31/2020.  Last refill 06/05/2020, #2 refills.

## 2020-09-25 ENCOUNTER — Telehealth: Payer: Self-pay | Admitting: *Deleted

## 2020-09-25 DIAGNOSIS — N760 Acute vaginitis: Secondary | ICD-10-CM

## 2020-09-25 MED ORDER — SULFAMETHOXAZOLE-TRIMETHOPRIM 800-160 MG PO TABS: 1.0000 | ORAL_TABLET | Freq: Two times a day (BID) | ORAL | 0 refills | Status: AC

## 2020-09-25 NOTE — Telephone Encounter (Signed)
Call placed to patient and patient made aware.  

## 2020-09-25 NOTE — Telephone Encounter (Signed)
Referral placed Bactrim antibioic refilled

## 2020-09-25 NOTE — Telephone Encounter (Signed)
Received call from patient.   Reports that abscess to groin has not completely resolved. States that area did decrease in size and was not painful to touch, but has now started to become inflamed again.   States that area is not draining, but it is hard and red to touch.   Note from 07/31/2020,   Treat with warm compresses oral antibiotics, if not improved will send to GYN for chronic lesion mons pubis, it does not appear to be a herpetic lesions   Patient requesting another round of ABTx while referral is being scheduled.   Please advise.

## 2020-10-05 ENCOUNTER — Other Ambulatory Visit: Payer: Self-pay | Admitting: Family Medicine

## 2020-10-11 ENCOUNTER — Other Ambulatory Visit: Payer: Self-pay | Admitting: Family Medicine

## 2020-11-22 ENCOUNTER — Other Ambulatory Visit: Payer: Self-pay | Admitting: Family Medicine

## 2020-11-30 ENCOUNTER — Other Ambulatory Visit: Payer: Self-pay

## 2020-11-30 MED ORDER — ALPRAZOLAM 1 MG PO TABS: 1.0000 mg | ORAL_TABLET | Freq: Three times a day (TID) | ORAL | 0 refills | Status: AC | PRN

## 2020-11-30 NOTE — Telephone Encounter (Signed)
Patient called need med refill ALPRAZolam Prudy Feeler) 1 MG tablet   Pharmacy: [id number] [place], [place] - [address]  [address] [place] [address]  Phone:  [phone] Fax:  [phone]

## 2020-11-30 NOTE — Telephone Encounter (Signed)
Ok to refill??  Last office visit 07/31/2020.  Last refill 09/07/2020.

## 2020-11-30 NOTE — Addendum Note (Signed)
Addended by: Phillips Odor on: 11/30/2020 02:00 PM   Modules accepted: Orders

## 2020-12-03 DIAGNOSIS — A6000 Herpesviral infection of urogenital system, unspecified: Secondary | ICD-10-CM | POA: Diagnosis not present

## 2020-12-03 DIAGNOSIS — F32A Depression, unspecified: Secondary | ICD-10-CM | POA: Diagnosis not present

## 2020-12-03 DIAGNOSIS — B373 Candidiasis of vulva and vagina: Secondary | ICD-10-CM | POA: Diagnosis not present

## 2020-12-03 DIAGNOSIS — F419 Anxiety disorder, unspecified: Secondary | ICD-10-CM | POA: Diagnosis not present

## 2020-12-07 ENCOUNTER — Other Ambulatory Visit: Payer: Self-pay | Admitting: Family Medicine

## 2020-12-13 ENCOUNTER — Other Ambulatory Visit: Payer: Self-pay | Admitting: Family Medicine

## 2021-01-05 DIAGNOSIS — Z131 Encounter for screening for diabetes mellitus: Secondary | ICD-10-CM | POA: Diagnosis not present

## 2021-01-05 DIAGNOSIS — Z712 Person consulting for explanation of examination or test findings: Secondary | ICD-10-CM | POA: Diagnosis not present

## 2021-01-05 DIAGNOSIS — Z0001 Encounter for general adult medical examination with abnormal findings: Secondary | ICD-10-CM | POA: Diagnosis not present

## 2021-01-05 DIAGNOSIS — Z0189 Encounter for other specified special examinations: Secondary | ICD-10-CM | POA: Diagnosis not present

## 2021-01-07 DIAGNOSIS — F32A Depression, unspecified: Secondary | ICD-10-CM | POA: Diagnosis not present

## 2021-01-07 DIAGNOSIS — E785 Hyperlipidemia, unspecified: Secondary | ICD-10-CM | POA: Diagnosis not present

## 2021-01-07 DIAGNOSIS — F419 Anxiety disorder, unspecified: Secondary | ICD-10-CM | POA: Diagnosis not present

## 2021-01-07 DIAGNOSIS — G4700 Insomnia, unspecified: Secondary | ICD-10-CM | POA: Diagnosis not present

## 2021-01-11 ENCOUNTER — Other Ambulatory Visit: Payer: Self-pay | Admitting: Family Medicine

## 2021-01-18 ENCOUNTER — Other Ambulatory Visit (HOSPITAL_COMMUNITY): Payer: Self-pay | Admitting: Family Medicine

## 2021-01-18 ENCOUNTER — Other Ambulatory Visit: Payer: Self-pay

## 2021-01-18 ENCOUNTER — Ambulatory Visit (HOSPITAL_COMMUNITY)
Admission: RE | Admit: 2021-01-18 | Discharge: 2021-01-18 | Disposition: A | Discharge status: Home / Self Care | Payer: BC Managed Care – PPO | Source: Ambulatory Visit | Attending: Family Medicine | Admitting: Family Medicine

## 2021-01-18 DIAGNOSIS — M7732 Calcaneal spur, left foot: Secondary | ICD-10-CM | POA: Diagnosis not present

## 2021-01-18 DIAGNOSIS — M79672 Pain in left foot: Secondary | ICD-10-CM | POA: Insufficient documentation

## 2021-01-18 DIAGNOSIS — M19072 Primary osteoarthritis, left ankle and foot: Secondary | ICD-10-CM | POA: Diagnosis not present

## 2021-01-20 ENCOUNTER — Other Ambulatory Visit: Payer: Self-pay | Admitting: Family Medicine

## 2021-01-26 ENCOUNTER — Other Ambulatory Visit: Payer: Self-pay | Admitting: Family Medicine

## 2021-04-26 ENCOUNTER — Other Ambulatory Visit: Payer: Self-pay | Admitting: Family Medicine

## 2021-06-10 DIAGNOSIS — E785 Hyperlipidemia, unspecified: Secondary | ICD-10-CM | POA: Diagnosis not present

## 2021-06-10 DIAGNOSIS — Z0000 Encounter for general adult medical examination without abnormal findings: Secondary | ICD-10-CM | POA: Diagnosis not present

## 2021-06-10 DIAGNOSIS — E569 Vitamin deficiency, unspecified: Secondary | ICD-10-CM | POA: Diagnosis not present

## 2021-06-10 DIAGNOSIS — R7309 Other abnormal glucose: Secondary | ICD-10-CM | POA: Diagnosis not present

## 2021-07-08 DIAGNOSIS — A6000 Herpesviral infection of urogenital system, unspecified: Secondary | ICD-10-CM | POA: Diagnosis not present

## 2021-07-08 DIAGNOSIS — Z23 Encounter for immunization: Secondary | ICD-10-CM | POA: Diagnosis not present

## 2021-07-08 DIAGNOSIS — G4700 Insomnia, unspecified: Secondary | ICD-10-CM | POA: Diagnosis not present

## 2021-07-08 DIAGNOSIS — F32A Depression, unspecified: Secondary | ICD-10-CM | POA: Diagnosis not present

## 2021-07-08 DIAGNOSIS — F419 Anxiety disorder, unspecified: Secondary | ICD-10-CM | POA: Diagnosis not present

## 2021-07-14 ENCOUNTER — Other Ambulatory Visit: Payer: Self-pay | Admitting: Family Medicine

## 2021-07-14 DIAGNOSIS — Z1231 Encounter for screening mammogram for malignant neoplasm of breast: Secondary | ICD-10-CM

## 2021-08-31 ENCOUNTER — Inpatient Hospital Stay: Admission: RE | Admit: 2021-08-31 | Payer: BC Managed Care – PPO | Source: Ambulatory Visit

## 2021-10-06 ENCOUNTER — Other Ambulatory Visit: Payer: Self-pay

## 2021-11-12 DIAGNOSIS — F32A Depression, unspecified: Secondary | ICD-10-CM | POA: Diagnosis not present

## 2021-11-12 DIAGNOSIS — B029 Zoster without complications: Secondary | ICD-10-CM | POA: Diagnosis not present

## 2021-11-12 DIAGNOSIS — F419 Anxiety disorder, unspecified: Secondary | ICD-10-CM | POA: Diagnosis not present

## 2021-11-12 DIAGNOSIS — J309 Allergic rhinitis, unspecified: Secondary | ICD-10-CM | POA: Diagnosis not present

## 2021-11-21 IMAGING — DX DG FOOT COMPLETE 3+V*L*
3 series · 3 of 3 positions shown · non-contrast
Comparison: None.

CLINICAL DATA: Left foot pain at the heel for 1 month. No known
injury.

EXAM:
LEFT FOOT - COMPLETE 3+ VIEW

[foot ap]
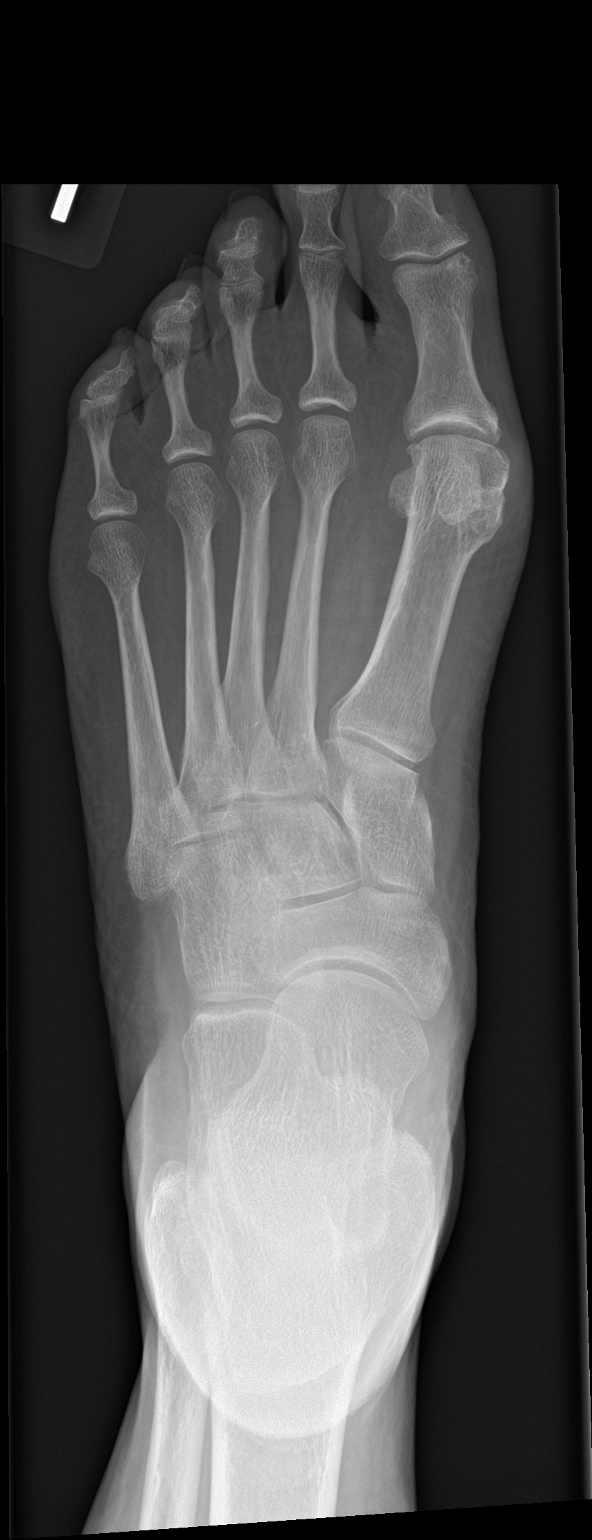

[foot obl]
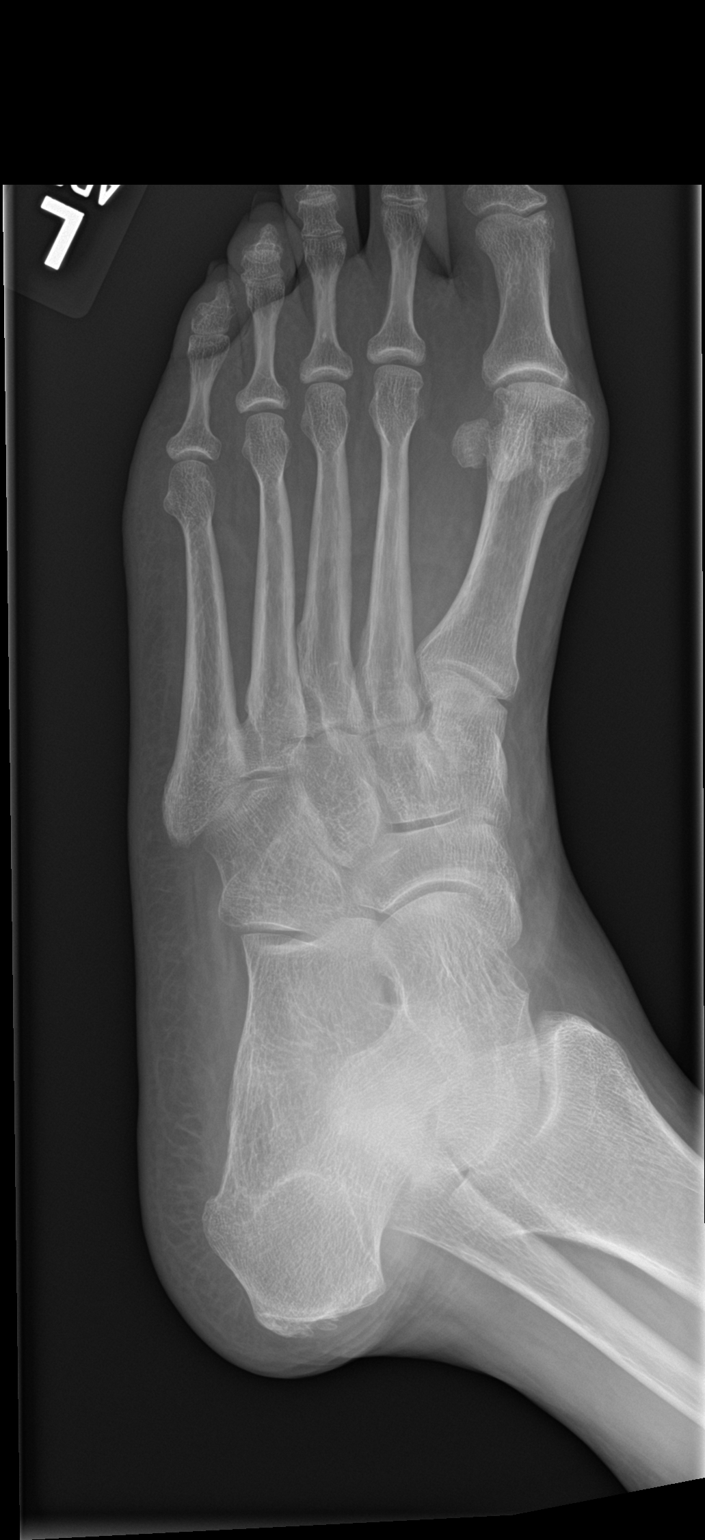

[foot lat]
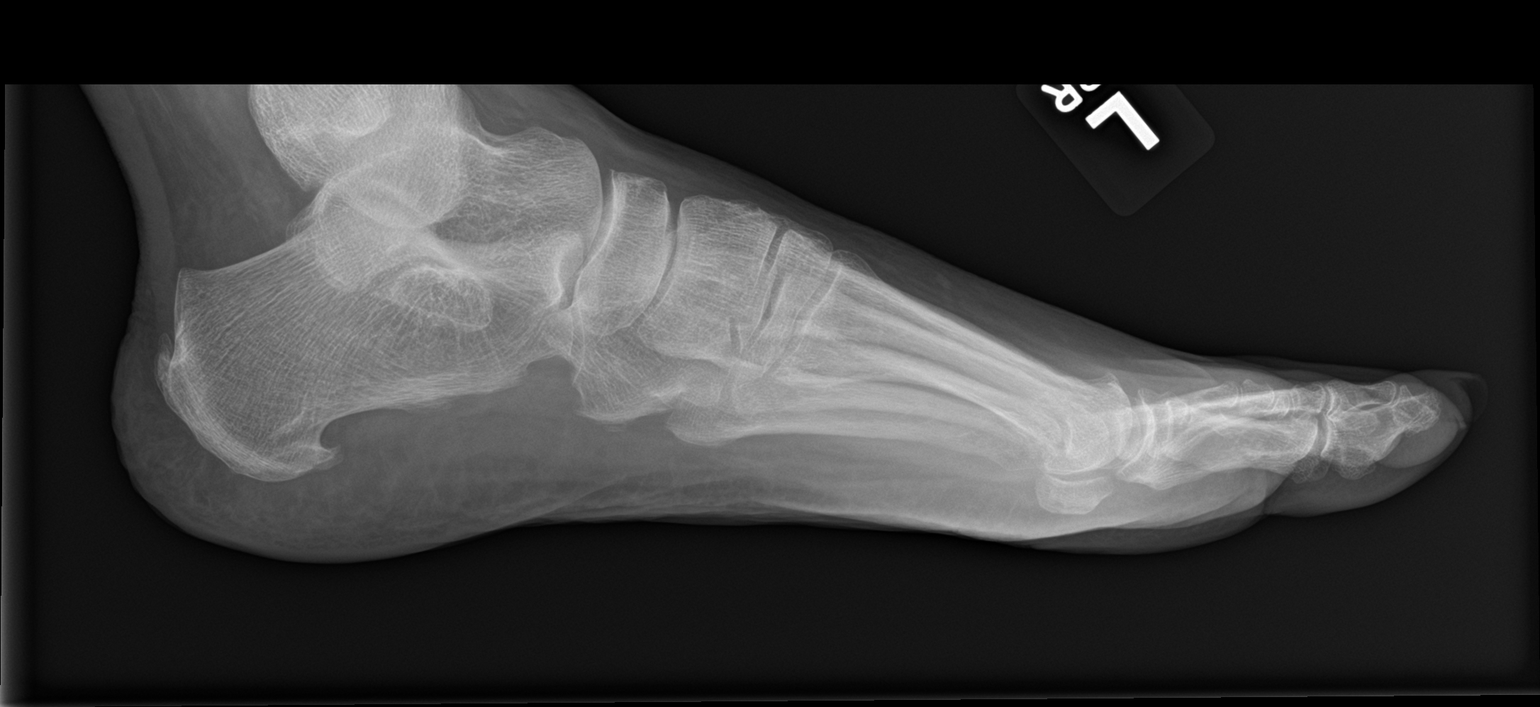

[3 of 3 positions shown; findings below may reference images not displayed]

FINDINGS: There is no acute bony or joint abnormality. Mild joint space
narrowing and osteophytosis are seen at the first MTP joint. Small
dorsal and plantar calcaneal spurs are noted. Soft tissues are
negative.
IMPRESSION: No acute abnormality.

Mild first MTP osteoarthritis.

Small plantar and dorsal calcaneal spurs.

## 2022-01-06 DIAGNOSIS — R7303 Prediabetes: Secondary | ICD-10-CM | POA: Diagnosis not present

## 2022-01-06 DIAGNOSIS — E785 Hyperlipidemia, unspecified: Secondary | ICD-10-CM | POA: Diagnosis not present

## 2022-01-06 DIAGNOSIS — Z139 Encounter for screening, unspecified: Secondary | ICD-10-CM | POA: Diagnosis not present

## 2022-01-11 DIAGNOSIS — Z0001 Encounter for general adult medical examination with abnormal findings: Secondary | ICD-10-CM | POA: Diagnosis not present

## 2022-07-08 DIAGNOSIS — F411 Generalized anxiety disorder: Secondary | ICD-10-CM | POA: Diagnosis not present

## 2022-07-08 DIAGNOSIS — Z01419 Encounter for gynecological examination (general) (routine) without abnormal findings: Secondary | ICD-10-CM | POA: Diagnosis not present

## 2022-07-25 DIAGNOSIS — E569 Vitamin deficiency, unspecified: Secondary | ICD-10-CM | POA: Diagnosis not present

## 2022-07-25 DIAGNOSIS — R7303 Prediabetes: Secondary | ICD-10-CM | POA: Diagnosis not present

## 2022-07-25 DIAGNOSIS — E785 Hyperlipidemia, unspecified: Secondary | ICD-10-CM | POA: Diagnosis not present

## 2022-09-14 DIAGNOSIS — F419 Anxiety disorder, unspecified: Secondary | ICD-10-CM | POA: Diagnosis not present

## 2022-09-14 DIAGNOSIS — Z23 Encounter for immunization: Secondary | ICD-10-CM | POA: Diagnosis not present

## 2022-09-14 DIAGNOSIS — A6009 Herpesviral infection of other urogenital tract: Secondary | ICD-10-CM | POA: Diagnosis not present

## 2022-09-14 DIAGNOSIS — G4700 Insomnia, unspecified: Secondary | ICD-10-CM | POA: Diagnosis not present

## 2022-09-14 DIAGNOSIS — A6000 Herpesviral infection of urogenital system, unspecified: Secondary | ICD-10-CM | POA: Diagnosis not present

## 2022-09-14 DIAGNOSIS — F32A Depression, unspecified: Secondary | ICD-10-CM | POA: Diagnosis not present

## 2022-09-16 ENCOUNTER — Other Ambulatory Visit: Payer: Self-pay | Admitting: Family Medicine

## 2022-09-16 DIAGNOSIS — Z1231 Encounter for screening mammogram for malignant neoplasm of breast: Secondary | ICD-10-CM

## 2022-09-19 ENCOUNTER — Ambulatory Visit: Payer: BC Managed Care – PPO

## 2022-11-04 ENCOUNTER — Ambulatory Visit: Payer: BC Managed Care – PPO

## 2022-12-16 ENCOUNTER — Ambulatory Visit
Admission: RE | Admit: 2022-12-16 | Discharge: 2022-12-16 | Disposition: A | Discharge status: Home / Self Care | Payer: Self-pay | Source: Ambulatory Visit | Attending: Family Medicine | Admitting: Family Medicine

## 2022-12-16 DIAGNOSIS — Z1231 Encounter for screening mammogram for malignant neoplasm of breast: Secondary | ICD-10-CM | POA: Diagnosis not present

## 2023-04-17 DIAGNOSIS — E569 Vitamin deficiency, unspecified: Secondary | ICD-10-CM | POA: Diagnosis not present

## 2023-04-17 DIAGNOSIS — E785 Hyperlipidemia, unspecified: Secondary | ICD-10-CM | POA: Diagnosis not present

## 2023-04-17 DIAGNOSIS — R7303 Prediabetes: Secondary | ICD-10-CM | POA: Diagnosis not present

## 2023-04-20 DIAGNOSIS — F419 Anxiety disorder, unspecified: Secondary | ICD-10-CM | POA: Diagnosis not present

## 2023-04-20 DIAGNOSIS — Z0001 Encounter for general adult medical examination with abnormal findings: Secondary | ICD-10-CM | POA: Diagnosis not present

## 2023-04-20 DIAGNOSIS — E569 Vitamin deficiency, unspecified: Secondary | ICD-10-CM | POA: Diagnosis not present

## 2023-04-20 DIAGNOSIS — R0600 Dyspnea, unspecified: Secondary | ICD-10-CM | POA: Diagnosis not present

## 2023-04-20 DIAGNOSIS — Z0000 Encounter for general adult medical examination without abnormal findings: Secondary | ICD-10-CM | POA: Diagnosis not present

## 2023-04-20 DIAGNOSIS — F32A Depression, unspecified: Secondary | ICD-10-CM | POA: Diagnosis not present

## 2023-04-20 DIAGNOSIS — G4700 Insomnia, unspecified: Secondary | ICD-10-CM | POA: Diagnosis not present

## 2023-09-19 DIAGNOSIS — R059 Cough, unspecified: Secondary | ICD-10-CM | POA: Diagnosis not present

## 2023-09-19 DIAGNOSIS — J4521 Mild intermittent asthma with (acute) exacerbation: Secondary | ICD-10-CM | POA: Diagnosis not present

## 2023-11-15 DIAGNOSIS — R7301 Impaired fasting glucose: Secondary | ICD-10-CM | POA: Diagnosis not present

## 2023-11-15 DIAGNOSIS — E569 Vitamin deficiency, unspecified: Secondary | ICD-10-CM | POA: Diagnosis not present

## 2023-11-15 DIAGNOSIS — E785 Hyperlipidemia, unspecified: Secondary | ICD-10-CM | POA: Diagnosis not present

## 2023-11-21 DIAGNOSIS — A6000 Herpesviral infection of urogenital system, unspecified: Secondary | ICD-10-CM | POA: Diagnosis not present

## 2023-11-21 DIAGNOSIS — F5105 Insomnia due to other mental disorder: Secondary | ICD-10-CM | POA: Diagnosis not present

## 2023-11-21 DIAGNOSIS — E785 Hyperlipidemia, unspecified: Secondary | ICD-10-CM | POA: Diagnosis not present

## 2023-11-21 DIAGNOSIS — G4700 Insomnia, unspecified: Secondary | ICD-10-CM | POA: Diagnosis not present

## 2023-11-21 DIAGNOSIS — F419 Anxiety disorder, unspecified: Secondary | ICD-10-CM | POA: Diagnosis not present

## 2023-11-21 DIAGNOSIS — F32A Depression, unspecified: Secondary | ICD-10-CM | POA: Diagnosis not present

## 2023-11-21 DIAGNOSIS — E569 Vitamin deficiency, unspecified: Secondary | ICD-10-CM | POA: Diagnosis not present

## 2024-04-09 DIAGNOSIS — R7303 Prediabetes: Secondary | ICD-10-CM | POA: Diagnosis not present

## 2024-04-09 DIAGNOSIS — E785 Hyperlipidemia, unspecified: Secondary | ICD-10-CM | POA: Diagnosis not present

## 2024-04-16 DIAGNOSIS — F419 Anxiety disorder, unspecified: Secondary | ICD-10-CM | POA: Diagnosis not present

## 2024-04-16 DIAGNOSIS — F5105 Insomnia due to other mental disorder: Secondary | ICD-10-CM | POA: Diagnosis not present

## 2024-04-16 DIAGNOSIS — R7303 Prediabetes: Secondary | ICD-10-CM | POA: Diagnosis not present

## 2024-04-16 DIAGNOSIS — F32A Depression, unspecified: Secondary | ICD-10-CM | POA: Diagnosis not present

## 2024-04-16 DIAGNOSIS — E785 Hyperlipidemia, unspecified: Secondary | ICD-10-CM | POA: Diagnosis not present

## 2024-04-17 ENCOUNTER — Other Ambulatory Visit: Payer: Self-pay | Admitting: Nurse Practitioner

## 2024-04-17 DIAGNOSIS — Z1231 Encounter for screening mammogram for malignant neoplasm of breast: Secondary | ICD-10-CM

## 2024-07-29 ENCOUNTER — Ambulatory Visit: Payer: Self-pay

## 2024-08-12 DIAGNOSIS — R7303 Prediabetes: Secondary | ICD-10-CM | POA: Diagnosis not present

## 2024-08-12 DIAGNOSIS — E785 Hyperlipidemia, unspecified: Secondary | ICD-10-CM | POA: Diagnosis not present

## 2024-08-16 DIAGNOSIS — Z1211 Encounter for screening for malignant neoplasm of colon: Secondary | ICD-10-CM | POA: Diagnosis not present

## 2024-08-23 ENCOUNTER — Ambulatory Visit: Payer: Self-pay

## 2024-08-24 LAB — COLOGUARD: COLOGUARD: NEGATIVE

## 2024-09-03 ENCOUNTER — Ambulatory Visit: Payer: Self-pay

## 2024-10-04 ENCOUNTER — Ambulatory Visit: Payer: Self-pay

## 2024-10-15 ENCOUNTER — Ambulatory Visit: Payer: Self-pay

## 2024-11-11 ENCOUNTER — Ambulatory Visit: Payer: Self-pay
